# Patient Record
Sex: Female | Born: 2000 | Hispanic: Yes | Marital: Single | Smoking: Never smoker
Health system: Southern US, Community
[De-identification: ages and names within clinical notes are randomized; demographics above are authoritative.]

## PROBLEM LIST (undated history)

## (undated) DIAGNOSIS — E282 Polycystic ovarian syndrome: Secondary | ICD-10-CM

## (undated) DIAGNOSIS — J45909 Unspecified asthma, uncomplicated: Secondary | ICD-10-CM

## (undated) HISTORY — PX: OTHER SURGICAL HISTORY: SHX169

---
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
## 2019-01-03 ENCOUNTER — Other Ambulatory Visit: Payer: Self-pay

## 2019-01-03 ENCOUNTER — Encounter (HOSPITAL_COMMUNITY): Payer: Self-pay

## 2019-01-03 ENCOUNTER — Ambulatory Visit (HOSPITAL_COMMUNITY)
Admission: EM | Admit: 2019-01-03 | Discharge: 2019-01-03 | Disposition: A | Discharge status: Home / Self Care | Payer: Medicaid Other | Attending: Family Medicine | Admitting: Family Medicine

## 2019-01-03 DIAGNOSIS — J069 Acute upper respiratory infection, unspecified: Secondary | ICD-10-CM | POA: Diagnosis not present

## 2019-01-03 DIAGNOSIS — B9789 Other viral agents as the cause of diseases classified elsewhere: Secondary | ICD-10-CM

## 2019-01-03 DIAGNOSIS — Z20828 Contact with and (suspected) exposure to other viral communicable diseases: Secondary | ICD-10-CM | POA: Diagnosis not present

## 2019-01-03 DIAGNOSIS — Z20822 Contact with and (suspected) exposure to covid-19: Secondary | ICD-10-CM

## 2019-01-03 NOTE — Discharge Instructions (Addendum)
If your Covid-19 test is positive, you will receive a phone call from [facility] regarding your results. Negative test results are not called. Both positive and negative results area always visible on MyChart. If you do not have a MyChart account, sign up instructions are in your discharge papers.  

## 2019-01-03 NOTE — ED Triage Notes (Signed)
Pt states she is congested and ear discomfort x 2 days

## 2019-01-04 NOTE — ED Provider Notes (Signed)
[place]   [id number] 01/03/19 Arrival Time: 4268  ASSESSMENT & PLAN:  1. Viral URI   2. Exposure to COVID-19 virus      COVID testing sent. To self-quarantine until she is feeling better and results are available.   OTC symptom care as needed. Ensure adequate fluid intake and rest. May f/u with PCP or here as needed.  Reviewed expectations re: course of current medical issues. Questions answered. Outlined signs and symptoms indicating need for more acute intervention. Patient verbalized understanding. After Visit Summary given.   SUBJECTIVE: History from: patient.  Casey Hall is a 18 y.o. female who presents with complaint of nasal congestion and ear discomfort. Onset abrupt, 2 d ago; with mild fatigue and without body aches. SOB: none. Wheezing: none. Fever: believed to be present, temp not taken. Overall normal PO intake without n/v. Possible exposure to COVID. No specific or significant aggravating or alleviating factors reported. OTC treatment: none reported.  Received flu shot this year: no.  Social History   Tobacco Use  Smoking Status Never Smoker  Smokeless Tobacco Never Used    ROS: As per HPI.   OBJECTIVE:  Vitals:   01/03/19 1414 01/03/19 1415  BP: 100/70   Pulse: 81   Resp: 16   Temp: 99.4 F (37.4 C)   TempSrc: Oral   SpO2: 98%   Weight:  59 kg     General appearance: alert; appears fatigued HEENT: mild nasal congestion; clear runny nose Neck: supple without LAD CV: RRR Lungs: unlabored respirations, symmetrical air entry without wheezing; cough: absent Abd: soft Ext: no LE edema Skin: warm and dry Psychological: alert and cooperative; normal mood and affect    No Known Allergies  Past Medical History:  Diagnosis Date  . Asthma   . PCOS (polycystic ovarian syndrome)    Family History  Problem Relation Age of Onset  . Healthy Mother   . Stroke Maternal Grandmother   . Heart disease Maternal Grandmother     Social History   Socioeconomic History  . Marital status: Single    Spouse name: Not on file  . Number of children: Not on file  . Years of education: Not on file  . Highest education level: Not on file  Occupational History  . Not on file  Social Needs  . Financial resource strain: Not on file  . Food insecurity    Worry: Not on file    Inability: Not on file  . Transportation needs    Medical: Not on file    Non-medical: Not on file  Tobacco Use  . Smoking status: Never Smoker  . Smokeless tobacco: Never Used  Substance and Sexual Activity  . Alcohol use: No    Alcohol/week: 0.0 standard drinks  . Drug use: No  . Sexual activity: Never  Lifestyle  . Physical activity    Days per week: Not on file    Minutes per session: Not on file  . Stress: Not on file  Relationships  . Social Herbalist on phone: Not on file    Gets together: Not on file    Attends religious service: Not on file    Active member of club or organization: Not on file    Attends meetings of clubs or organizations: Not on file    Relationship status: Not on file  . Intimate partner violence    Fear of current or ex partner: Not on file    Emotionally abused:  Not on file    Physically abused: Not on file    Forced sexual activity: Not on file  Other Topics Concern  . Not on file  Social History Narrative   Lives with mom, step-dad, brother and sister.  In 9th grade at [employer].            Mardella Layman, MD 01/04/19 1047

## 2019-01-05 LAB — NOVEL CORONAVIRUS, NAA (HOSP ORDER, SEND-OUT TO REF LAB; TAT 18-24 HRS): SARS-CoV-2, NAA: NOT DETECTED

## 2019-03-12 ENCOUNTER — Encounter: Payer: Self-pay | Admitting: Family

## 2019-03-13 ENCOUNTER — Encounter: Payer: Self-pay | Admitting: Family

## 2019-03-14 ENCOUNTER — Other Ambulatory Visit: Payer: Self-pay

## 2019-03-14 ENCOUNTER — Other Ambulatory Visit (HOSPITAL_COMMUNITY)
Admission: RE | Admit: 2019-03-14 | Discharge: 2019-03-14 | Disposition: A | Discharge status: Home / Self Care | Payer: Medicaid Other | Source: Ambulatory Visit | Attending: Pediatrics | Admitting: Pediatrics

## 2019-03-14 ENCOUNTER — Other Ambulatory Visit: Payer: Medicaid Other

## 2019-03-14 DIAGNOSIS — Z113 Encounter for screening for infections with a predominantly sexual mode of transmission: Secondary | ICD-10-CM

## 2019-03-14 NOTE — Progress Notes (Signed)
Patient came in for labs Wet prep and Gc urine. Labs ordered by Alfonso Ramus. Successful collection.

## 2019-03-15 ENCOUNTER — Encounter: Payer: Self-pay | Admitting: Family

## 2019-03-15 ENCOUNTER — Other Ambulatory Visit: Payer: Self-pay | Admitting: Pediatrics

## 2019-03-15 LAB — WET PREP BY MOLECULAR PROBE
Candida species: DETECTED — AB
MICRO NUMBER:: 10138470
SPECIMEN QUALITY:: ADEQUATE
Trichomonas vaginosis: NOT DETECTED

## 2019-03-15 MED ORDER — METRONIDAZOLE 500 MG PO TABS: 500.0000 mg | ORAL_TABLET | Freq: Two times a day (BID) | ORAL | 0 refills | Status: AC

## 2019-03-15 MED ORDER — FLUCONAZOLE 150 MG PO TABS: ORAL_TABLET | ORAL | 0 refills | Status: DC

## 2019-03-16 LAB — URINE CYTOLOGY ANCILLARY ONLY
Chlamydia: POSITIVE — AB
Comment: NEGATIVE
Comment: NORMAL
Neisseria Gonorrhea: NEGATIVE

## 2019-03-19 ENCOUNTER — Encounter: Payer: Self-pay | Admitting: Family

## 2019-03-20 ENCOUNTER — Other Ambulatory Visit: Payer: Self-pay

## 2019-03-20 ENCOUNTER — Encounter: Payer: Self-pay | Admitting: Family

## 2019-03-20 ENCOUNTER — Other Ambulatory Visit (INDEPENDENT_AMBULATORY_CARE_PROVIDER_SITE_OTHER): Payer: Medicaid Other

## 2019-03-20 DIAGNOSIS — A749 Chlamydial infection, unspecified: Secondary | ICD-10-CM

## 2019-03-20 MED ORDER — AZITHROMYCIN 500 MG PO TABS
1000.0000 mg | ORAL_TABLET | Freq: Once | ORAL | Status: AC
  Administered 2019-03-20: 14:00:00 1000 mg via ORAL

## 2019-03-20 NOTE — Progress Notes (Unsigned)
Pt here today for STI treatment. Allergies reviewed. Medication tolerated well. Pt education given- along with importance to abstain from sex for 7 days to ensure infection has cured. Follow up appointment scheduled with provider.  

## 2019-03-26 NOTE — Progress Notes (Unsigned)
Patient came in for labs Wet prep and GC urine. Labs ordered by Bernell List. Successful collection.

## 2019-04-05 ENCOUNTER — Ambulatory Visit: Payer: Medicaid Other | Admitting: Family

## 2019-04-09 ENCOUNTER — Encounter: Payer: Self-pay | Admitting: Family

## 2019-04-12 ENCOUNTER — Other Ambulatory Visit: Payer: Self-pay

## 2019-04-12 ENCOUNTER — Ambulatory Visit (INDEPENDENT_AMBULATORY_CARE_PROVIDER_SITE_OTHER): Payer: Medicaid Other | Admitting: Family

## 2019-04-12 ENCOUNTER — Encounter: Payer: Self-pay | Admitting: Family

## 2019-04-12 ENCOUNTER — Other Ambulatory Visit (HOSPITAL_COMMUNITY)
Admission: RE | Admit: 2019-04-12 | Discharge: 2019-04-12 | Disposition: A | Discharge status: Home / Self Care | Payer: Medicaid Other | Source: Ambulatory Visit | Attending: Family | Admitting: Family

## 2019-04-12 VITALS — BP 114/69 | HR 68 | Ht 62.21 in | Wt 136.2 lb

## 2019-04-12 DIAGNOSIS — Z3046 Encounter for surveillance of implantable subdermal contraceptive: Secondary | ICD-10-CM

## 2019-04-12 DIAGNOSIS — Z113 Encounter for screening for infections with a predominantly sexual mode of transmission: Secondary | ICD-10-CM

## 2019-04-12 DIAGNOSIS — Z3009 Encounter for other general counseling and advice on contraception: Secondary | ICD-10-CM | POA: Diagnosis not present

## 2019-04-12 DIAGNOSIS — A749 Chlamydial infection, unspecified: Secondary | ICD-10-CM

## 2019-04-12 NOTE — Patient Instructions (Signed)
Follow-up  in 1 month. Schedule this appointment before you leave clinic today.  Your Nexplanon was removed today and is no longer preventing pregnancy.  If you have sex, remember to use condoms to prevent pregnancy and to prevent sexually transmitted infections.  Leave the outside bandage on for 24 hours.  Leave the smaller bandages on for 3-5 days or until they fall off on their own.  Keep the area clean and dry for 3-5 days.  There is usually bruising or swelling at and around the removal site for a few days to a week after the removal.  If you see redness or pus draining from the removal site, call us immediately.  We would like you to return to the clinic for a follow-up visit in 1 month.  You can call [facility] 24 hours a day with any questions or concerns.  There is always a nurse or doctor available to take your call.  Call 9-1-1 if you have a life-threatening emergency.  For anything else, please call us at [phone] before heading to the ER.  

## 2019-04-12 NOTE — Progress Notes (Addendum)
Adolescent Medicine Consultation Follow-Up Visit Casey Hall  is a 19 y.o. female referred by Corena Herter, MD here today for follow-up regarding Nexplanon removal.    Plan at last adolescent specialty clinic visit included: 03/31/2018: PCOS visit - continue Nexplanon for period suppression  -acne -- po abx and benzaclin gel  Pertinent Labs? No Growth Chart Viewed? n/a   History was provided by the patient.  Interpreter? no  Chief complaint:  Nexplanon removal  Desire for STD testing  HPI:   PCP Confirmed?  yes  My Chart Activated?   yes   Casey Hall presents today for nexplanon removal. She would like it out to see if she has a period (has had complete suppression on nexplanon). Is sexually active with multiple female partners. Occasional condom use. She tested positive for chlamydia 2 weeks ago and was treated with "pills only". No symptoms at the time or at present. Specifically, no vaginal discharge, pain, or itching. Wants to retest today for STDs today. Reports that she doesn't know who may have given it to her. Has not told her partners yet.  She has no plans for next steps in terms of BC. Would like more condoms today  No abdominal pain, fever, or vomiting/diarrhea. ROS otherwise negative.   Casey Hall had her nexplanon placed in her L arm in April 2019.    No LMP recorded. Patient has had an implant. No Known Allergies Current Outpatient Medications on File Prior to Visit  Medication Sig Dispense Refill  . etonogestrel (NEXPLANON) 68 MG IMPL implant 1 each by Subdermal route once.    . fluconazole (DIFLUCAN) 150 MG tablet Take 1 tablet today and 1 tablet 3 days from now (Patient not taking: Reported on 04/12/2019) 2 tablet 0  . silver sulfADIAZINE (SILVADENE) 1 % cream Apply 1 application topically 2 (two) times daily. (Patient not taking: Reported on 04/12/2019) 50 g 0   No current facility-administered medications on file prior to visit.    Patient  Active Problem List   Diagnosis Date Noted  . Nexplanon in place 04/01/2018  . Bilateral nipple discharge 07/28/2015  . Constipation 11/25/2014  . PCOS (polycystic ovarian syndrome) 07/09/2014  . Vitamin D deficiency 07/09/2014  . 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  female  Sexually Active?  yes; removal of nexplanon as noted above. Plans for condoms after   The following portions of the patient's history were reviewed and updated as appropriate: allergies, current medications, past family history, past medical history, past social history, past surgical history and problem list.  Physical Exam:  Vitals:   04/12/19 1046  BP: 114/69  Pulse: 68  Weight: 136 lb 3.2 oz (61.8 kg)  Height: 5' 2.21" (1.58 m)   BP 114/69   Pulse 68   Ht 5' 2.21" (1.58 m)   Wt 136 lb 3.2 oz (61.8 kg)   BMI 24.75 kg/m  Body mass index: body mass index is 24.75 kg/m. Blood pressure percentiles are not available for patients who are 18 years or older.  Physical Exam Vitals and nursing note reviewed.  Constitutional:      General: She is not in acute distress.    Appearance: Normal appearance. She is not ill-appearing, toxic-appearing or diaphoretic.  HENT:     Head: Normocephalic.     Nose: Nose normal. No congestion or rhinorrhea.  Mouth/Throat:     Mouth: Mucous membranes are moist.  Eyes:     Conjunctiva/sclera: Conjunctivae normal.  Cardiovascular:     Rate and Rhythm: Normal rate.     Pulses: Normal pulses.  Pulmonary:     Effort: Pulmonary effort is normal.  Abdominal:     General: Abdomen is flat. There is no distension.  Genitourinary:    Comments: Deferred Skin:    General: Skin is warm.     Capillary Refill: Capillary refill takes less than 2 seconds.     Comments: 2 small scars on L forearm at the site of  nexplanon insertion. No discoloration.    NEXPLANON REMOVAL: Risks & benefits of Nexplanon removal discussed. Consent form signed.  The patient denies any allergies to anesthetics or antiseptics.  Procedure: Pt was placed in supine position. left arm was flexed at the elbow and externally rotated so that her wrist was parallel to her ear, The device was palpated and marked. The site was cleaned with Betadine. The area surrounding the device was covered with a sterile drape. 1% lidocaine was injected just under the device. A scalpel was used to create a small incision. The device was pushed towards the incision. Fibrous tissue surrounding the device was gradually removed from the device. The device was removed and measured to ensure all 4 cm of device was removed. Steri-strips were used to close the incision. Pressure dressing was applied to the patient.  The patient was instructed to removed the pressure dressing in 24 hrs.  The patient was advised to move slowly from a supine to an upright position  The patient denied any concerns or complaints  The patient was instructed to schedule a follow-up appt in 1 month. The patient will be called in 1 week to address any concerns.  Assessment/Plan:  1. Encounter for Nexplanon removal - Nexplanon removed today. We discussed how she will no longer be protected from pregnancy and how removing it "to see if she bleeds" may not be the best indication for removal. Patient expressed understanding and elected to proceed. See procedure note above. Tolerated procedure well with minimal blood loss - f/u in 1 month  2. Chlamydia infection 3. Routine screening for STI (sexually transmitted infection) - had chlamydia two weeks ago and was treated - Asymptomatic thien, asymptomatic now - Has been sexually active since. Partners have not been told and have not been tested/treated as far as she knows - Advised her to tell her partners and recommend  they get tested - Will retest and treat if needed today. - Talked about importance of abstinence for the next week or linger if partner(s) not tested/treated - Urine cytology ancillary only  4. Birth control counseling - return in 1 mo to re-discuss starting another hormonal contraception method (not interested today) - Condoms and EC reviewed  Follow-up:  Return for mood and nexplanon removal f/u in 1 mo.   Medical decision-making:  >25 minutes spent face to face with patient with more than 50% of appointment spent discussing diagnosis, management, follow-up, and reviewing of prior records.  Renee Rival, MD  Supervising Provider Co-Signature  I reviewed with the resident the medical history and the resident's findings.  I discussed with the resident the patient's diagnosis and concur with the treatment plan as documented in the resident's note.  Parthenia Ames, NP

## 2019-04-13 ENCOUNTER — Encounter: Payer: Self-pay | Admitting: Family

## 2019-04-13 LAB — URINE CYTOLOGY ANCILLARY ONLY
Chlamydia: POSITIVE — AB
Comment: NEGATIVE
Comment: NORMAL
Neisseria Gonorrhea: NEGATIVE

## 2019-04-16 ENCOUNTER — Other Ambulatory Visit: Payer: Self-pay | Admitting: Family

## 2019-04-16 MED ORDER — AZITHROMYCIN 500 MG PO TABS: 1000.0000 mg | ORAL_TABLET | Freq: Once | ORAL | 0 refills | Status: AC

## 2019-04-26 ENCOUNTER — Encounter (HOSPITAL_COMMUNITY): Payer: Self-pay

## 2019-04-26 ENCOUNTER — Other Ambulatory Visit: Payer: Self-pay

## 2019-04-26 ENCOUNTER — Emergency Department (HOSPITAL_COMMUNITY)
Admission: EM | Admit: 2019-04-26 | Discharge: 2019-04-27 | Disposition: A | Discharge status: Home / Self Care | Payer: Medicaid Other | Attending: Emergency Medicine | Admitting: Emergency Medicine

## 2019-04-26 DIAGNOSIS — Z5321 Procedure and treatment not carried out due to patient leaving prior to being seen by health care provider: Secondary | ICD-10-CM | POA: Diagnosis not present

## 2019-04-26 DIAGNOSIS — M545 Low back pain: Secondary | ICD-10-CM | POA: Insufficient documentation
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
## 2019-04-27 ENCOUNTER — Ambulatory Visit (HOSPITAL_COMMUNITY)
Admission: EM | Admit: 2019-04-27 | Discharge: 2019-04-27 | Disposition: A | Discharge status: Home / Self Care | Payer: Medicaid Other

## 2019-04-27 ENCOUNTER — Encounter (HOSPITAL_COMMUNITY): Payer: Self-pay

## 2019-04-27 ENCOUNTER — Other Ambulatory Visit: Payer: Self-pay

## 2019-04-27 DIAGNOSIS — M545 Low back pain, unspecified: Secondary | ICD-10-CM

## 2019-04-27 MED ORDER — TIZANIDINE HCL 4 MG PO TABS: 4.0000 mg | ORAL_TABLET | Freq: Three times a day (TID) | ORAL | 0 refills | Status: DC | PRN

## 2019-04-27 MED ORDER — NAPROXEN 500 MG PO TABS: 500.0000 mg | ORAL_TABLET | Freq: Two times a day (BID) | ORAL | 0 refills | Status: DC
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
## 2019-04-27 NOTE — ED Notes (Signed)
Pt called for room, no answer x1  °
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
## 2019-05-04 ENCOUNTER — Encounter: Payer: Self-pay | Admitting: Family

## 2019-05-14 ENCOUNTER — Encounter: Payer: Self-pay | Admitting: Family

## 2019-05-14 ENCOUNTER — Other Ambulatory Visit: Payer: Self-pay

## 2019-05-14 ENCOUNTER — Other Ambulatory Visit (HOSPITAL_COMMUNITY)
Admission: RE | Admit: 2019-05-14 | Discharge: 2019-05-14 | Disposition: A | Discharge status: Home / Self Care | Payer: Medicaid Other | Source: Ambulatory Visit | Attending: Psychology | Admitting: Psychology

## 2019-05-14 ENCOUNTER — Other Ambulatory Visit: Payer: Medicaid Other

## 2019-05-14 DIAGNOSIS — Z113 Encounter for screening for infections with a predominantly sexual mode of transmission: Secondary | ICD-10-CM

## 2019-05-15 NOTE — Progress Notes (Signed)
Patient came in for labs GC urine. Labs ordered by Alfonso Ramus. Successful collection.

## 2019-05-16 ENCOUNTER — Telehealth: Payer: Medicaid Other | Admitting: Family

## 2019-05-16 NOTE — Progress Notes (Deleted)
Pre-Visit Planning Dorena Bodo Clinical Staff Visit Tasks:   - Urine GC/CT due? {Yes/No-Ex:120004} - HIV Screening due?  {Yes/No-Ex:120004} - POCT or Other?{Responses; yes/no/unknown/maybe/na:33144} - Psych Screenings Due? {Responses; yes/no/unknown/maybe/na:33144} - [facility] Involvement? {Responses; yes/no/unknown/maybe/na:33144} @TODAY @  @NAME @  is a @AGEPEDS @ @SEX @ referred by @PCP @ for ***.  Review of records sent: ***  Previous Psych Screenings? {Responses; yes/no/unknown/maybe/na:33144}  Plan at last visit? {Responses; yes/no/unknown/maybe/na:33144} Pertinent Labs? {Responses; yes/no/unknown/maybe/na:33144}    This note is not being shared with the patient for the following reason: {Block Note Sharing with Patient:23411}  THIS RECORD MAY CONTAIN CONFIDENTIAL INFORMATION THAT SHOULD NOT BE RELEASED WITHOUT REVIEW OF THE SERVICE PROVIDER.  Virtual Follow-Up Visit via Video Note  I connected with Casey Hall 's {family members:20773}  on 05/16/19 at 10:30 AM EDT by a video enabled telemedicine application and verified that I am speaking with the correct person using two identifiers.   Patient/parent location: ***   I discussed the limitations of evaluation and management by telemedicine and the availability of in person appointments.  I discussed that the purpose of this telehealth visit is to provide medical care while limiting exposure to the novel coronavirus.  The {family members:20773} expressed understanding and agreed to proceed.   Casey Hall is a 19 y.o. female referred by , MD here today for follow-up of ***.  Previsit planning completed:  {YES/NO/NOT APPLICABLE:20182}   History was provided by the {CHL AMB PERSONS; PED RELATIVES/OTHER W/PATIENT:[phone]}.  Plan from Last Visit:  04/12/19 -Nexplanon removed -Treated for chlamydia reinfection  Chief Complaint: ***  History of Present Illness:  ***    ROS:  ***  No Known  Allergies Outpatient Medications Prior to Visit  Medication Sig Dispense Refill  . azithromycin (ZITHROMAX) 500 MG tablet TAKE 2 TABLETS BY MOUTH ONCE FOR 1 DOSE    . naproxen (NAPROSYN) 500 MG tablet Take 1 tablet (500 mg total) by mouth 2 (two) times daily with a meal. 30 tablet 0  . tiZANidine (ZANAFLEX) 4 MG tablet Take 1 tablet (4 mg total) by mouth every 8 (eight) hours as needed for muscle spasms. 30 tablet 0   No facility-administered medications prior to visit.     Patient Active Problem List   Diagnosis Date Noted  . Nexplanon in place 04/01/2018  . Bilateral nipple discharge 07/28/2015  . Constipation 11/25/2014  . PCOS (polycystic ovarian syndrome) 07/09/2014  . Vitamin D deficiency 07/09/2014  . History of irregular menstrual bleeding 07/09/2014    Social History: Lives with:  {Persons; PED relatives w/patient:19415} and describes home situation as *** School: In Grade *** at 07/30/2015 Future Plans:  {CHL AMB PED FUTURE 11/27/2014 Exercise:  {Exercise:23478} Sports:  {Misc; sports:10024} Sleep:  {SX; SLEEP PATTERNS:18802}  Confidentiality was discussed with the patient and if applicable, with caregiver as well.  Patient's personal or confidential phone number: *** Enter confidential phone number in Family Comments section of [employer] Tobacco?  {YES/NO/WILD 09/08/2014 Drugs/ETOH?  {YES/NO/WILD 09/08/2014 Partner preference?  {CHL AMB PARTNER [phone]} Sexually Active?  {YES/NO/WILD 09/08/2014  Pregnancy Prevention:  {Pregnancy [phone]}, reviewed condoms & plan B Trauma currently or in the pastt?  {YES/NO/WILD [employer] Suicidal or Self-Harm thoughts?   {YES/NO/WILD ZOXWR:[id number]} Guns in the home?  {YES/NO/WILD BJYNW:29562}  {Common ambulatory SmartLinks:19316}  Visual Observations/Objective:  *** General Appearance: Well nourished well developed, in no apparent distress.  Eyes: conjunctiva no swelling or  erythema ENT/Mouth: No hoarseness, No cough for duration of visit.  Neck: Supple  Respiratory: Respiratory  effort normal, normal rate, no retractions or distress.   Cardio: Appears well-perfused, noncyanotic Musculoskeletal: no obvious deformity Skin: visible skin without rashes, ecchymosis, erythema Neuro: Awake and oriented X 3,  Psych:  normal affect, Insight and Judgment appropriate.    Assessment/Plan: There are no diagnoses linked to this encounter.   I discussed the assessment and treatment plan with the patient and/or parent/guardian.  They were provided an opportunity to ask questions and all were answered.  They agreed with the plan and demonstrated an understanding of the instructions. They were advised to call back or seek an in-person evaluation in the emergency room if the symptoms worsen or if the condition fails to improve as anticipated.   Follow-up:   ***  Medical decision-making:   I spent *** minutes on this telehealth visit inclusive of face-to-face video and care coordination time I was located *** during this encounter.   Mellody Drown, MD    CC: Patient, No Pcp Per, Madelyn Flavors, MD

## 2019-05-17 LAB — URINE CYTOLOGY ANCILLARY ONLY
Chlamydia: POSITIVE — AB
Comment: NEGATIVE
Comment: NEGATIVE
Comment: NORMAL
Neisseria Gonorrhea: NEGATIVE
Trichomonas: NEGATIVE

## 2019-05-18 ENCOUNTER — Encounter: Payer: Self-pay | Admitting: Family

## 2019-05-21 ENCOUNTER — Other Ambulatory Visit: Payer: Self-pay | Admitting: Family

## 2019-05-21 MED ORDER — AZITHROMYCIN 500 MG PO TABS: 1000.0000 mg | ORAL_TABLET | Freq: Once | ORAL | 0 refills | Status: AC

## 2019-05-22 ENCOUNTER — Encounter: Payer: Self-pay | Admitting: Family

## 2019-05-22 NOTE — Progress Notes (Signed)
Not seen. Closed for admin purposes.  

## 2019-05-29 ENCOUNTER — Encounter: Payer: Self-pay | Admitting: Family

## 2019-06-18 ENCOUNTER — Encounter: Payer: Self-pay | Admitting: Family

## 2019-06-19 ENCOUNTER — Other Ambulatory Visit: Payer: Medicaid Other

## 2019-06-19 ENCOUNTER — Other Ambulatory Visit (HOSPITAL_COMMUNITY)
Admission: RE | Admit: 2019-06-19 | Discharge: 2019-06-19 | Disposition: A | Discharge status: Home / Self Care | Payer: Medicaid Other | Source: Ambulatory Visit | Attending: Family | Admitting: Family

## 2019-06-19 ENCOUNTER — Other Ambulatory Visit: Payer: Self-pay

## 2019-06-19 DIAGNOSIS — Z113 Encounter for screening for infections with a predominantly sexual mode of transmission: Secondary | ICD-10-CM | POA: Diagnosis not present

## 2019-06-19 NOTE — Progress Notes (Signed)
GC urine collected. SM CMA

## 2019-06-20 LAB — URINE CYTOLOGY ANCILLARY ONLY
Chlamydia: NEGATIVE
Comment: NEGATIVE
Comment: NEGATIVE
Comment: NORMAL
Neisseria Gonorrhea: NEGATIVE
Trichomonas: NEGATIVE

## 2021-08-13 ENCOUNTER — Encounter: Payer: Medicaid Other | Admitting: Radiology

## 2023-02-02 NOTE — L&D Delivery Note (Addendum)
 Delivery Note Casey Hall is a 22 y.o. G1P0 at [redacted]w[redacted]d admitted for labor.   GBS Status:  PRESUMPTIVE NEGATIVE/-- (10/15 2208)  Labor course: Initial SVE: 6.5/100/-3. Augmentation with: AROM. She then progressed to complete.  ROM: 4h 82m with moderate mec stained fluid  Birth: After a 30 minute 2nd stage, she delivered a Live born female  Birth Weight:   APGAR: 8, 9  Newborn Delivery   Birth date/time: 11/18/2023 00:07:23 Delivery type: Vaginal, Vacuum assisted    FHR would dip into the 70s-80s w/pushing, generally returning to baseline However, at +4 station, FHRin the 60s and 70s. Dr. Abigail notified of impending VAD. Verbal consent: obtained from patient. Risks and benefits discussed in detail. Risks include, but are not limited to bleeding, infection, damage to maternal tissues, fetal cephalhematoma. There is also the risk of inability to effect vaginal delivery of the head, or shoulder dystocia that cannot be resolved by established maneuvers, leading to the need for emergency cesarean section     Delivered via Vacuum assisted vaginal delivery.  (Presentation: OA ). Nuchal cord present: No. . Shoulders and body delivered in usual fashion. Infant placed directly on mom's abdomen for bonding/skin-to-skin, baby dried and stimulated. At this point, Dr. Abigail in room and took over care (CNM had to go to another delivery). Cord clamped x 2 after 1 minute and cut by FOB.  Cord blood collected. Placenta delivered-Spontaneous with 3 vessels. 20u Pitocin in 500cc LR given as a bolus prior to delivery of placenta.  Fundus firm with massage. Placenta inspected and appears to be intact with a 3 VC.  Sponge and instrument count were correct x2.  Intrapartum complications:  None Anesthesia:  local Lacerations:  2nd degree Suture Repair: 2.0 3.0 vicryl EBL (mL):769.00   PER DR DUNN:   Delivery was performed by CNM Cresenzo. She was called to another delivery and I performed delivery of the  placenta and repair of 2nd degree perineal laceration. Placenta delivered without difficulty. Second degree laceration and right labial laceration was repaired in the usual fashion with 2-0 and 3-0 vicryl after injecting 10 ml of 1% lidocaine  for local anesthetic. TXA was given.   Rollo ONEIDA Abigail, MD, FACOG Obstetrician & Gynecologist, [facility] for [facility], [facility]    Mom to postpartum.  Baby to Couplet care / Skin to Skin. Placenta to L&D   Plans to Breast and bottlefeed Contraception: abstinence Circumcision: declines  Delivery Report:   Review the Delivery Report for details.     Signed: Cathlean Ely, DNP,CNM 11/18/2023, 2:36 AM

## 2023-07-03 ENCOUNTER — Other Ambulatory Visit: Payer: Self-pay

## 2023-07-03 ENCOUNTER — Encounter (HOSPITAL_COMMUNITY): Payer: Self-pay | Admitting: *Deleted

## 2023-07-03 ENCOUNTER — Ambulatory Visit (HOSPITAL_COMMUNITY)
Admission: EM | Admit: 2023-07-03 | Discharge: 2023-07-03 | Disposition: A | Discharge status: Home / Self Care | Attending: Physician Assistant | Admitting: Physician Assistant

## 2023-07-03 DIAGNOSIS — J302 Other seasonal allergic rhinitis: Secondary | ICD-10-CM

## 2023-07-03 DIAGNOSIS — Z3A21 21 weeks gestation of pregnancy: Secondary | ICD-10-CM | POA: Diagnosis not present

## 2023-07-03 DIAGNOSIS — J4521 Mild intermittent asthma with (acute) exacerbation: Secondary | ICD-10-CM

## 2023-07-03 MED ORDER — ALBUTEROL SULFATE HFA 108 (90 BASE) MCG/ACT IN AERS: 1.0000 | INHALATION_SPRAY | Freq: Four times a day (QID) | RESPIRATORY_TRACT | 0 refills | Status: AC | PRN

## 2023-07-03 NOTE — ED Triage Notes (Signed)
 PT reports congestion with ear pressure and sore throat for 4 days.

## 2023-07-03 NOTE — Discharge Instructions (Signed)
 Good to meet you today.  The good news is I do not see any further signs of acute infection or further need for antibiotics at this time.  You are likely experiencing common cold or allergies that are triggering your history of asthma.  Without wheezing on exam, I do not think you need any steroids, and this would need to be used very cautiously as you are pregnant anyway.  I do think that albuterol rescue inhaler is appropriate to use as directed to help with your asthma symptoms.  Please refer to the handout provided for medications safe in pregnancy such as Claritin, saline nasal drops, Sudafed (only for a few days, and as long as no history of high blood pressure).  Keep well-hydrated.  Try to rest.  If asthma symptoms persist or worsen, you will need to follow-up with your OB/GYN and/or possibly pulmonology.

## 2023-07-03 NOTE — ED Triage Notes (Signed)
 PT EDD Oct 2025

## 2023-07-03 NOTE — ED Provider Notes (Signed)
 [facility]   MRN: [id number] DOB: 24-Jun-2000  Subjective:   Casey Hall is a 23 y.o. female presenting for sore throat and ear pressure, some congestion, going on the last 4 days.  She is here with her mother.  She is [redacted] weeks pregnant.  She states that she had a right ear infection last week and was treated with amoxicillin.  She has since completed this course, and she has not been taking any other over-the-counter medication.  She has a history of asthma as a child and has not had any flareups in a long time, but feels similar symptoms in her chest.  Denies any shortness of breath or chest pain.  Feels like she is kind of wheezy at night.  She does not have a rescue inhaler.  Denies any fever or chills.    No current facility-administered medications for this encounter.  Current Outpatient Medications:    albuterol (VENTOLIN HFA) 108 (90 Base) MCG/ACT inhaler, Inhale 1-2 puffs into the lungs every 6 (six) hours as needed for wheezing or shortness of breath., Disp: 18 g, Rfl: 0   No Known Allergies  Past Medical History:  Diagnosis Date   Asthma    PCOS (polycystic ovarian syndrome)      Past Surgical History:  Procedure Laterality Date   implanon       Family History  Problem Relation Age of Onset   Healthy Mother    Stroke Maternal Grandmother    Heart disease Maternal Grandmother     Social History   Tobacco Use   Smoking status: Never   Smokeless tobacco: Never  Substance Use Topics   Alcohol use: No    Alcohol/week: 0.0 standard drinks of alcohol   Drug use: No    ROS REFER TO HPI FOR PERTINENT POSITIVES AND NEGATIVES   Objective:   Vitals: BP 111/74   Pulse 93   Temp 98.1 F (36.7 C)   Resp 20   LMP 02/05/2023   SpO2 96%   Physical Exam Vitals and nursing note reviewed.  Constitutional:      General: She is not in acute distress.    Appearance: Normal appearance. She is not ill-appearing.  HENT:     Head:  Normocephalic.     Right Ear: Tympanic membrane, ear canal and external ear normal.     Left Ear: Tympanic membrane, ear canal and external ear normal.     Nose: Congestion present.     Mouth/Throat:     Mouth: Mucous membranes are moist.     Pharynx: No oropharyngeal exudate or posterior oropharyngeal erythema.  Eyes:     Extraocular Movements: Extraocular movements intact.     Conjunctiva/sclera: Conjunctivae normal.     Pupils: Pupils are equal, round, and reactive to light.  Cardiovascular:     Rate and Rhythm: Normal rate and regular rhythm.     Pulses: Normal pulses.     Heart sounds: Normal heart sounds. No murmur heard. Pulmonary:     Effort: Pulmonary effort is normal. No respiratory distress.     Breath sounds: Normal breath sounds. No wheezing.  Musculoskeletal:     Cervical back: Normal range of motion.  Skin:    General: Skin is warm.  Neurological:     Mental Status: She is alert and oriented to person, place, and time.  Psychiatric:        Mood and Affect: Mood normal.        Behavior: Behavior  normal.     No results found for this or any previous visit (from the past 24 hours).  Assessment and Plan :   PDMP not reviewed this encounter.  1. Seasonal allergies   2. Mild intermittent asthma with acute exacerbation   3. [redacted] weeks gestation of pregnancy    No acute signs of infection on exam.  Overall well-appearing.  No active wheezing on exam, although she notes wheezing at night.  I have provided her with a new rescue inhaler.  I have also provided her with a list of over-the-counter medications that are safe in pregnancy.  I do not see a need for further antibiotics at this time, and she has just completed a course amoxicillin.  She will use the albuterol inhaler as needed.  She will use nasal saline, fluids, Claritin or Sudafed.  She will follow-up with her OB/GYN and/or pulmonology if needed.  Return to care precautions discussed.  Patient agreeable.     AllwardtDeleta Felix, PA-C 07/03/23 1153

## 2023-11-12 ENCOUNTER — Inpatient Hospital Stay (HOSPITAL_COMMUNITY): Admit: 2023-11-12

## 2023-11-16 ENCOUNTER — Other Ambulatory Visit: Payer: Self-pay

## 2023-11-16 ENCOUNTER — Encounter (HOSPITAL_COMMUNITY): Payer: Self-pay | Admitting: Obstetrics and Gynecology

## 2023-11-16 ENCOUNTER — Inpatient Hospital Stay (HOSPITAL_COMMUNITY)
Admission: AD | Admit: 2023-11-16 | Discharge: 2023-11-16 | Disposition: A | Discharge status: Home / Self Care | Source: Home / Self Care | Attending: Obstetrics and Gynecology | Admitting: Obstetrics and Gynecology

## 2023-11-16 DIAGNOSIS — Z3A40 40 weeks gestation of pregnancy: Secondary | ICD-10-CM | POA: Insufficient documentation

## 2023-11-16 DIAGNOSIS — O479 False labor, unspecified: Secondary | ICD-10-CM

## 2023-11-16 DIAGNOSIS — O471 False labor at or after 37 completed weeks of gestation: Secondary | ICD-10-CM | POA: Insufficient documentation

## 2023-11-16 LAB — URINALYSIS, ROUTINE W REFLEX MICROSCOPIC
Bilirubin Urine: NEGATIVE
Glucose, UA: NEGATIVE mg/dL
Hgb urine dipstick: NEGATIVE
Ketones, ur: NEGATIVE mg/dL
Nitrite: NEGATIVE
Protein, ur: NEGATIVE mg/dL
Specific Gravity, Urine: 1.02 (ref 1.005–1.030)
pH: 5 (ref 5.0–8.0)

## 2023-11-16 LAB — CBC
HCT: 32.5 % — ABNORMAL LOW (ref 36.0–46.0)
Hemoglobin: 10.1 g/dL — ABNORMAL LOW (ref 12.0–15.0)
MCH: 22.6 pg — ABNORMAL LOW (ref 26.0–34.0)
MCHC: 31.1 g/dL (ref 30.0–36.0)
MCV: 72.7 fL — ABNORMAL LOW (ref 80.0–100.0)
Platelets: 227 K/uL (ref 150–400)
RBC: 4.47 MIL/uL (ref 3.87–5.11)
RDW: 16.5 % — ABNORMAL HIGH (ref 11.5–15.5)
WBC: 6.9 K/uL (ref 4.0–10.5)
nRBC: 0 % (ref 0.0–0.2)

## 2023-11-16 LAB — POCT FERN TEST: POCT Fern Test: NEGATIVE

## 2023-11-16 LAB — WET PREP, GENITAL
Sperm: NONE SEEN
Trich, Wet Prep: NONE SEEN
WBC, Wet Prep HPF POC: >=10 — AB (ref <10)
Yeast Wet Prep HPF POC: NONE SEEN

## 2023-11-16 LAB — GROUP B STREP BY PCR: Group B strep by PCR: NEGATIVE

## 2023-11-16 LAB — RUPTURE OF MEMBRANE (ROM)PLUS: Rom Plus: NEGATIVE

## 2023-11-16 LAB — HEMOGLOBIN A1C
Hgb A1c MFr Bld: 5 % (ref 4.8–5.6)
Mean Plasma Glucose: 96.8 mg/dL

## 2023-11-16 NOTE — MAU Note (Signed)
 MAU Labor Evaluation RN Medical Screening Exam: RN Assessment:  .Casey Hall is a 23 y.o. at [redacted]w[redacted]d here in MAU for evaluation of labor. See RN triage note.   Pain Score: 0-No pain Pain Location: Abdomen  Cervical exam:  Dilation: 3 Effacement (%): 50 Station: -3 Exam by:: Nat Mule, RN  Fetal Monitoring: Baseline Rate (A): 145 bpm Variability: Moderate Accelerations: 15 x 15 Decelerations: None Contraction Frequency (min): occ with UI  Vitals:   11/16/23 2111 11/16/23 2255  BP: 118/78 119/73  Pulse: 84 66  Resp: 18   Temp: 99.4 F (37.4 C)   SpO2: 100%       Medical Decision Making & Provider Communication:  This RN has communicated with: Joesph Sear, PA and C. Cashion, MD  Communicated with MAU provider SBAR report of labor evaluation. Also reviewed contraction pattern and that non-stress test is reactive.  Contraction Frequency (min): occ with UI has been documented with no cervical change over 2 hours not indicating active labor.  Patient denies any other complaints.  Based on this report provider has given order for discharge. A discharge order and diagnosis entered by a provider. Labor discharge precautions reviewed with patient and all questions answered. Patient verbalized understanding.   Plan of Care: Discharge home with strict labor precautions

## 2023-11-16 NOTE — Progress Notes (Signed)
 Reviewed records in Care Everywhere. See relevant OB lab results below:  Initial GC/CT neg HBsAg nonreactive Hep C ab nonreactive HIV negative Rubella IgG antibody Positive RPR nonreactive  Pap 10/21/2021 NILM

## 2023-11-16 NOTE — MAU Note (Signed)
 MAU Labor Triage Note:  .Casey Hall is a 24 y.o. at [redacted]w[redacted]d here in MAU reporting:  Contractions every: irregularly Onset of ctx: 2 or 3 days Pain Score: 6  Pain Location: Abdomen  ROM: possible leaking describes it as a dripping faucet, not gushing Vaginal Bleeding: denies Last SVE: none Labor Pain Management Plan: Planning unmedicated  GBS: Negative  Fetal Movement: Reports positive FM FHT: Fetal Heart Rate Mode: External Baseline Rate (A): 135 bpm  Vitals:   11/16/23 2111  BP: 118/78  Pulse: 84  Resp: 18  Temp: 99.4 F (37.4 C)  SpO2: 100%      Lab orders placed from triage: MAU Labor Eval and Fern swab OB Office: was getting prenatal care in [place] at the [facility] in [place], is going to be here until after baby is born, but has not established care.

## 2023-11-16 NOTE — MAU Provider Note (Signed)
 False labor

## 2023-11-17 ENCOUNTER — Encounter (HOSPITAL_COMMUNITY): Payer: Self-pay | Admitting: Obstetrics and Gynecology

## 2023-11-17 ENCOUNTER — Inpatient Hospital Stay (HOSPITAL_COMMUNITY)
Admission: AD | Admit: 2023-11-17 | Discharge: 2023-11-19 | DRG: 807 | Disposition: A | Discharge status: Home / Self Care | Attending: Obstetrics and Gynecology | Admitting: Obstetrics and Gynecology

## 2023-11-17 DIAGNOSIS — O480 Post-term pregnancy: Secondary | ICD-10-CM | POA: Diagnosis present

## 2023-11-17 DIAGNOSIS — O26893 Other specified pregnancy related conditions, third trimester: Secondary | ICD-10-CM | POA: Diagnosis present

## 2023-11-17 DIAGNOSIS — Z148 Genetic carrier of other disease: Secondary | ICD-10-CM | POA: Diagnosis not present

## 2023-11-17 DIAGNOSIS — Z3A40 40 weeks gestation of pregnancy: Secondary | ICD-10-CM | POA: Diagnosis not present

## 2023-11-17 DIAGNOSIS — Z8249 Family history of ischemic heart disease and other diseases of the circulatory system: Secondary | ICD-10-CM | POA: Diagnosis not present

## 2023-11-17 LAB — CBC
HCT: 36 % (ref 36.0–46.0)
Hemoglobin: 11 g/dL — ABNORMAL LOW (ref 12.0–15.0)
MCH: 22.4 pg — ABNORMAL LOW (ref 26.0–34.0)
MCHC: 30.6 g/dL (ref 30.0–36.0)
MCV: 73.3 fL — ABNORMAL LOW (ref 80.0–100.0)
Platelets: 240 K/uL (ref 150–400)
RBC: 4.91 MIL/uL (ref 3.87–5.11)
RDW: 16.9 % — ABNORMAL HIGH (ref 11.5–15.5)
WBC: 8.7 K/uL (ref 4.0–10.5)
nRBC: 0 % (ref 0.0–0.2)

## 2023-11-17 LAB — RAPID HIV SCREEN (HIV 1/2 AB+AG)
HIV 1/2 Antibodies: NONREACTIVE
HIV-1 P24 Antigen - HIV24: NONREACTIVE

## 2023-11-17 LAB — TYPE AND SCREEN
ABO/RH(D): O POS
Antibody Screen: NEGATIVE

## 2023-11-17 LAB — GC/CHLAMYDIA PROBE AMP (~~LOC~~) NOT AT ARMC
Chlamydia: NEGATIVE
Comment: NEGATIVE
Comment: NORMAL
Neisseria Gonorrhea: NEGATIVE

## 2023-11-17 LAB — ABO/RH: ABO/RH(D): O POS

## 2023-11-17 LAB — HEPATITIS B SURFACE ANTIGEN: Hepatitis B Surface Ag: NONREACTIVE

## 2023-11-17 MED ORDER — SODIUM CHLORIDE 0.9% FLUSH: 3.0000 mL | Freq: Two times a day (BID) | INTRAVENOUS | Status: DC

## 2023-11-17 MED ORDER — LIDOCAINE HCL (PF) 1 % IJ SOLN
30.0000 mL | INTRAMUSCULAR | Status: AC | PRN
  Administered 2023-11-18: 30 mL via SUBCUTANEOUS
  Filled 2023-11-17: qty 30

## 2023-11-17 MED ORDER — HYDROXYZINE HCL 50 MG PO TABS: 50.0000 mg | ORAL_TABLET | Freq: Four times a day (QID) | ORAL | Status: DC | PRN

## 2023-11-17 MED ORDER — TERBUTALINE SULFATE 1 MG/ML IJ SOLN: 0.2500 mg | Freq: Once | INTRAMUSCULAR | Status: DC | PRN

## 2023-11-17 MED ORDER — FENTANYL CITRATE (PF) 100 MCG/2ML IJ SOLN
50.0000 ug | INTRAMUSCULAR | Status: DC | PRN
  Administered 2023-11-17 – 2023-11-18 (×2): 100 ug via INTRAVENOUS
  Filled 2023-11-17 (×2): qty 2

## 2023-11-17 MED ORDER — SODIUM CHLORIDE 0.9% FLUSH: 3.0000 mL | INTRAVENOUS | Status: DC | PRN

## 2023-11-17 MED ORDER — OXYCODONE-ACETAMINOPHEN 5-325 MG PO TABS: 1.0000 | ORAL_TABLET | ORAL | Status: DC | PRN

## 2023-11-17 MED ORDER — OXYTOCIN BOLUS FROM INFUSION
333.0000 mL | Freq: Once | INTRAVENOUS | Status: AC
  Administered 2023-11-18: 333 mL via INTRAVENOUS

## 2023-11-17 MED ORDER — ACETAMINOPHEN 325 MG PO TABS: 650.0000 mg | ORAL_TABLET | ORAL | Status: DC | PRN

## 2023-11-17 MED ORDER — ONDANSETRON HCL 4 MG/2ML IJ SOLN: 4.0000 mg | Freq: Four times a day (QID) | INTRAMUSCULAR | Status: DC | PRN

## 2023-11-17 MED ORDER — SODIUM CHLORIDE 0.9 % IV SOLN: 250.0000 mL | INTRAVENOUS | Status: DC | PRN

## 2023-11-17 MED ORDER — OXYTOCIN-SODIUM CHLORIDE 30-0.9 UT/500ML-% IV SOLN
2.5000 [IU]/h | INTRAVENOUS | Status: DC
  Administered 2023-11-18: 2.5 [IU]/h via INTRAVENOUS
  Filled 2023-11-17: qty 500

## 2023-11-17 MED ORDER — SOD CITRATE-CITRIC ACID 500-334 MG/5ML PO SOLN: 30.0000 mL | ORAL | Status: DC | PRN

## 2023-11-17 MED ORDER — LACTATED RINGERS IV SOLN: INTRAVENOUS | Status: DC

## 2023-11-17 MED ORDER — LACTATED RINGERS IV SOLN: 500.0000 mL | INTRAVENOUS | Status: DC | PRN

## 2023-11-17 MED ORDER — OXYCODONE-ACETAMINOPHEN 5-325 MG PO TABS: 2.0000 | ORAL_TABLET | ORAL | Status: DC | PRN

## 2023-11-17 NOTE — Progress Notes (Signed)
 Coping well w/ctx.  FHR a40, min to mod variability, no accels, no decels (was tracing mom for a little while).  Ctx q 2 min utes.  AROM at 1930, mod mec stained fluid. Cx 8/100/-1.  Continue expectant mgt.

## 2023-11-17 NOTE — Progress Notes (Signed)
 Labor Progress Note Casey Hall is a 23 y.o. female G1P0 with IUP at [redacted]w[redacted]d by US  presenting for painful contractions and feeling baby move but less than usual.   S:  Casey Hall reports her contractions are getting stronger and more painful. She is pacing around the room and periodically squatting to help loosen her muscles. She is adamant about not having an epidural. Her mother and grandmother are walking around the room with her.   She understands that because she is now 6.5, IV pain medication is less recommended from here on out. She plans on natural pain management for now with the walking, squatting, stretching, and breathing techniques.   O:  BP 113/84   Pulse 92   Temp 97.9 F (36.6 C) (Oral)   Resp 18   LMP 02/05/2023   SpO2 100%   EFM: baseline 135 bpm/ good variability/ + accels/ -- decels  Toco/IUPC: 1.5-3 minutes SVE: Dilation: 6.5 Effacement (%): 100 Station: -3 Exam by:: Casey Montenegro RN Pitocin: None  A/P: Casey Hall is a 23 y.o. female G1P0 with IUP at [redacted]w[redacted]d by US  presenting for painful contractions and feeling baby move but less than usual. 1. Labor: Expectant management 2. FWB: Cat 1 3. Pain: Natural 4. GBS: presumed negative  MOF: Breast milk Contraception: None  Anticipate SVD.  Terrace Compton, MD 5:41 PM

## 2023-11-17 NOTE — MAU Note (Signed)
.  Casey Hall is a 23 y.o. at [redacted]w[redacted]d here in MAU reporting: contractions since 1100 this morning. Denies vaginal bleeding and LOF. States she is feeling baby move less.  Onset of complaint: 10/16 Pain score: 8/10 Vitals:   11/17/23 1516  BP: 127/76  Pulse: 98  Resp: 18  Temp: 99 F (37.2 C)  SpO2: 100%     FHT: 130  Lab orders placed from triage: UA

## 2023-11-17 NOTE — H&P (Addendum)
 OBSTETRIC ADMISSION HISTORY AND PHYSICAL  Casey Hall is a 23 y.o. female G1P0 with IUP at [redacted]w[redacted]d by US  presenting for painful contractions and feeling baby move but less than usual. She reports +FMs, No LOF, no VB, no blurry vision, headaches or peripheral edema, and RUQ pain.  She plans on breast feeding. She does not want birth control. She received her prenatal care at the [place] in [place], [place] . She is active duty Hotel manager. She wanted to deliver here because this is where her family lives and she wanted to be around her immediate family.   Dating: By US  --->  Estimated Date of Delivery: 11/12/23  Sono: unable to obtain; patient reports being told baby's anatomy was normal, was head down, and her placenta was posterior.   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  Past Medical History: Past Medical History:  Diagnosis Date   Asthma    PCOS (polycystic ovarian syndrome)     Past Surgical History: Past Surgical History:  Procedure Laterality Date   CAUTERIZE INNER NOSE Bilateral    implanon       Obstetrical History: OB History     Gravida  1   Para      Term      Preterm      AB      Living         SAB      IAB      Ectopic      Multiple      Live Births             Social History Social History   Socioeconomic History   Marital status: Single    Spouse name: Not on file   Number of children: Not on file   Years of education: Not on file   Highest education level: Not on file  Occupational History   Not on file  Tobacco Use   Smoking status: Never   Smokeless tobacco: Never  Vaping Use   Vaping status: Never Used  Substance and Sexual Activity   Alcohol use: No    Alcohol/week: 0.0 standard drinks of alcohol   Drug use: No   Sexual activity: Never    Birth control/protection: None  Other Topics Concern   Not on file  Social History Narrative   Lives with mom, step-dad,  brother and sister.  In 9th grade at [employer].    Social Drivers of Corporate investment banker Strain: Not on file  Food Insecurity: Not on file  Transportation Needs: Not on file  Physical Activity: Not on file  Stress: Not on file  Social Connections: Not on file    Family History: Family History  Problem Relation Age of Onset   Healthy Mother    Stroke Maternal Grandmother    Heart disease Maternal Grandmother     Allergies: No Known Allergies  Medications Prior to Admission  Medication Sig Dispense Refill Last Dose/Taking   albuterol  (VENTOLIN  HFA) 108 (90 Base) MCG/ACT inhaler Inhale 1-2 puffs into the lungs every 6 (six) hours as needed for wheezing or shortness of breath. 18 g 0    Prenatal Vit-Fe Fumarate-FA (MULTIVITAMIN-PRENATAL) 27-0.8 MG TABS tablet Take 1 tablet by mouth daily at 12 noon.        Review of Systems   All systems reviewed and negative except as stated in HPI  Last menstrual period 02/05/2023. General appearance: alert, cooperative, and no distress Lungs: clear to  auscultation bilaterally Heart: regular rate and rhythm Abdomen: soft, non-tender; bowel sounds normal Pelvic: As below Extremities: Homans sign is negative, no sign of DVT Presentation: cephalic Fetal monitoring Baseline: 140 bpm, Variability: Good {> 6 bpm), Accelerations: Reactive, and Decelerations: Absent Uterine activity: irregular/irritable Dilation: 6.5 Effacement (%): 100 Station: -3 Exam by:: Lorice Montenegro RN   Prenatal labs: ABO, Rh: --/--/O POS (10/15 2223) Antibody:   Rubella:   RPR:    HBsAg:    HIV:    GBS: PRESUMPTIVE NEGATIVE/-- (10/15 2208)    Lab Results  Component Value Date   GBS PRESUMPTIVE NEGATIVE 11/16/2023   GTT: WNL Genetic screening: WNL Anatomy US  WNL  Immunization History  Administered Date(s) Administered   DTaP 12/15/1999, 02/18/2000, 05/19/2000, 02/16/2001, 11/12/2003   H1N1 01/03/2008, 02/17/2008   HIB (PRP-OMP)  12/15/1999, 02/18/2000, 05/19/2000, 11/11/2000   HPV Quadrivalent 06/07/2011, 09/09/2011, 04/04/2012   Hepatitis A 02/02/2005, 02/17/2006   Hepatitis B 1999/10/16, 12/15/1999, 05/19/2000   IPV 12/15/1999, 02/18/2000, 11/11/2000, 11/12/2003   Influenza,inj,Quad PF,6+ Mos 12/12/2017   Influenza-Unspecified 01/08/2004, 12/19/2004, 01/15/2006, 12/17/2006, 01/03/2008, 12/03/2008, 11/18/2009, 11/23/2010, 01/22/2012, 12/13/2012, 11/26/2013   MMR 11/11/2000, 11/12/2003   Meningococcal Conjugate 06/07/2011   Moderna Sars-Covid-2 Vaccination 08/25/2020   PFIZER(Purple Top)SARS-COV-2 Vaccination 08/13/2019, 09/17/2019   Pneumococcal-Unspecified 12/15/1999, 02/18/2000, 05/19/2000, 11/17/2001   Td 09/23/2010   Tdap 09/23/2010   Varicella 11/11/2000, 02/02/2005    Prenatal Transfer Tool  Maternal Diabetes: No Genetic Screening: Normal Maternal Ultrasounds/Referrals: Normal Fetal Ultrasounds or other Referrals:  None Maternal Substance Abuse:  No Significant Maternal Medications:  None Significant Maternal Lab Results: Other: presuming GBS(-) Number of Prenatal Visits:greater than 3 verified prenatal visits Maternal Vaccinations:TDap Other Comments:  None   Results for orders placed or performed during the hospital encounter of 11/16/23 (from the past 24 hours)  Urinalysis, Routine w reflex microscopic -Urine, Clean Catch   Collection Time: 11/16/23  9:28 PM  Result Value Ref Range   Color, Urine YELLOW YELLOW   APPearance HAZY (A) CLEAR   Specific Gravity, Urine 1.020 1.005 - 1.030   pH 5.0 5.0 - 8.0   Glucose, UA NEGATIVE NEGATIVE mg/dL   Hgb urine dipstick NEGATIVE NEGATIVE   Bilirubin Urine NEGATIVE NEGATIVE   Ketones, ur NEGATIVE NEGATIVE mg/dL   Protein, ur NEGATIVE NEGATIVE mg/dL   Nitrite NEGATIVE NEGATIVE   Leukocytes,Ua MODERATE (A) NEGATIVE   RBC / HPF 0-5 0 - 5 RBC/hpf   WBC, UA 11-20 0 - 5 WBC/hpf   Bacteria, UA MANY (A) NONE SEEN   Squamous Epithelial / HPF 0-5 0 - 5  /HPF   Mucus PRESENT    Non Squamous Epithelial 0-5 (A) NONE SEEN  GC/Chlamydia probe amp ([place])not at [facility]   Collection Time: 11/16/23  9:35 PM  Result Value Ref Range   Neisseria Gonorrhea Negative    Chlamydia Negative    Comment Normal Reference Ranger Chlamydia - Negative    Comment      Normal Reference Range Neisseria Gonorrhea - Negative  POCT fern test   Collection Time: 11/16/23  9:44 PM  Result Value Ref Range   POCT Fern Test Negative = intact amniotic membranes   Wet prep, genital   Collection Time: 11/16/23 10:07 PM  Result Value Ref Range   Yeast Wet Prep HPF POC NONE SEEN NONE SEEN   Trich, Wet Prep NONE SEEN NONE SEEN   Clue Cells Wet Prep HPF POC PRESENT (A) NONE SEEN   WBC, Wet Prep HPF POC >=10 (A) <10  Sperm NONE SEEN   Rupture of Membrane (ROM) Plus   Collection Time: 11/16/23 10:07 PM  Result Value Ref Range   Rom Plus NEGATIVE   Group B strep by PCR   Collection Time: 11/16/23 10:08 PM   Specimen: Genital  Result Value Ref Range   Group B strep by PCR PRESUMPTIVE NEGATIVE PRESUMPTIVE NEGATIVE  CBC   Collection Time: 11/16/23 10:23 PM  Result Value Ref Range   WBC 6.9 4.0 - 10.5 K/uL   RBC 4.47 3.87 - 5.11 MIL/uL   Hemoglobin 10.1 (L) 12.0 - 15.0 g/dL   HCT 67.4 (L) 63.9 - 53.9 %   MCV 72.7 (L) 80.0 - 100.0 fL   MCH 22.6 (L) 26.0 - 34.0 pg   MCHC 31.1 30.0 - 36.0 g/dL   RDW 83.4 (H) 88.4 - 84.4 %   Platelets 227 150 - 400 K/uL   nRBC 0.0 0.0 - 0.2 %  Hemoglobin A1c   Collection Time: 11/16/23 10:23 PM  Result Value Ref Range   Hgb A1c MFr Bld 5.0 4.8 - 5.6 %   Mean Plasma Glucose 96.8 mg/dL  ABO/Rh   Collection Time: 11/16/23 10:23 PM  Result Value Ref Range   ABO/RH(D) O POS    No rh immune globuloin      NOT A RH IMMUNE GLOBULIN CANDIDATE, PT RH POSITIVE Performed at [facility], [address] [place], [place] [address]     Patient Active Problem List   Diagnosis Date Noted   Nexplanon  in place 04/01/2018    Bilateral nipple discharge 07/28/2015   Constipation 11/25/2014   PCOS (polycystic ovarian syndrome) 07/09/2014   Vitamin D  deficiency 07/09/2014   History of irregular menstrual bleeding 07/09/2014    Assessment/Plan:  Ulah Olmo is a 23 y.o. G1P0 at [redacted]w[redacted]d here for SOL.   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

## 2023-11-18 ENCOUNTER — Encounter (HOSPITAL_COMMUNITY): Payer: Self-pay | Admitting: Obstetrics and Gynecology

## 2023-11-18 DIAGNOSIS — O480 Post-term pregnancy: Secondary | ICD-10-CM

## 2023-11-18 DIAGNOSIS — Z3A40 40 weeks gestation of pregnancy: Secondary | ICD-10-CM

## 2023-11-18 LAB — RPR: RPR Ser Ql: NONREACTIVE

## 2023-11-18 MED ORDER — ONDANSETRON HCL 4 MG PO TABS: 4.0000 mg | ORAL_TABLET | ORAL | Status: DC | PRN

## 2023-11-18 MED ORDER — ONDANSETRON HCL 4 MG/2ML IJ SOLN: 4.0000 mg | INTRAMUSCULAR | Status: DC | PRN

## 2023-11-18 MED ORDER — DIBUCAINE (PERIANAL) 1 % EX OINT: 1.0000 | TOPICAL_OINTMENT | CUTANEOUS | Status: DC | PRN

## 2023-11-18 MED ORDER — COCONUT OIL OIL: 1.0000 | TOPICAL_OIL | Status: DC | PRN

## 2023-11-18 MED ORDER — TRANEXAMIC ACID-NACL 1000-0.7 MG/100ML-% IV SOLN
INTRAVENOUS | Status: AC
  Filled 2023-11-18: qty 100

## 2023-11-18 MED ORDER — TETANUS-DIPHTH-ACELL PERTUSSIS 5-2-15.5 LF-MCG/0.5 IM SUSP: 0.5000 mL | Freq: Once | INTRAMUSCULAR | Status: DC

## 2023-11-18 MED ORDER — METHYLERGONOVINE MALEATE 0.2 MG PO TABS: 0.2000 mg | ORAL_TABLET | ORAL | Status: DC | PRN

## 2023-11-18 MED ORDER — SENNOSIDES-DOCUSATE SODIUM 8.6-50 MG PO TABS
2.0000 | ORAL_TABLET | ORAL | Status: DC
  Administered 2023-11-18 – 2023-11-19 (×2): 2 via ORAL
  Filled 2023-11-18 (×2): qty 2

## 2023-11-18 MED ORDER — FERROUS SULFATE 325 (65 FE) MG PO TABS
325.0000 mg | ORAL_TABLET | ORAL | Status: DC
  Administered 2023-11-18: 325 mg via ORAL
  Filled 2023-11-18: qty 1

## 2023-11-18 MED ORDER — METHYLERGONOVINE MALEATE 0.2 MG/ML IJ SOLN: 0.2000 mg | INTRAMUSCULAR | Status: DC | PRN

## 2023-11-18 MED ORDER — MEDROXYPROGESTERONE ACETATE 150 MG/ML IM SUSP: 150.0000 mg | INTRAMUSCULAR | Status: DC | PRN

## 2023-11-18 MED ORDER — ACETAMINOPHEN 325 MG PO TABS: 650.0000 mg | ORAL_TABLET | ORAL | Status: DC | PRN

## 2023-11-18 MED ORDER — DIPHENHYDRAMINE HCL 25 MG PO CAPS: 25.0000 mg | ORAL_CAPSULE | Freq: Four times a day (QID) | ORAL | Status: DC | PRN

## 2023-11-18 MED ORDER — IBUPROFEN 600 MG PO TABS
600.0000 mg | ORAL_TABLET | Freq: Four times a day (QID) | ORAL | Status: DC
  Administered 2023-11-18 – 2023-11-19 (×4): 600 mg via ORAL
  Filled 2023-11-18 (×5): qty 1

## 2023-11-18 MED ORDER — WITCH HAZEL-GLYCERIN EX PADS: 1.0000 | MEDICATED_PAD | CUTANEOUS | Status: DC | PRN

## 2023-11-18 MED ORDER — BENZOCAINE-MENTHOL 20-0.5 % EX AERO: 1.0000 | INHALATION_SPRAY | CUTANEOUS | Status: DC | PRN

## 2023-11-18 MED ORDER — SIMETHICONE 80 MG PO CHEW: 80.0000 mg | CHEWABLE_TABLET | ORAL | Status: DC | PRN

## 2023-11-18 MED ORDER — FLEET ENEMA RE ENEM: 1.0000 | ENEMA | Freq: Every day | RECTAL | Status: DC | PRN

## 2023-11-18 MED ORDER — BISACODYL 10 MG RE SUPP: 10.0000 mg | Freq: Every day | RECTAL | Status: DC | PRN

## 2023-11-18 MED ORDER — PRENATAL MULTIVITAMIN CH
1.0000 | ORAL_TABLET | Freq: Every day | ORAL | Status: DC
  Administered 2023-11-18 – 2023-11-19 (×2): 1 via ORAL
  Filled 2023-11-18 (×2): qty 1

## 2023-11-18 MED ORDER — MEASLES, MUMPS & RUBELLA VAC ~~LOC~~ SUSR: 0.5000 mL | Freq: Once | SUBCUTANEOUS | Status: DC

## 2023-11-18 MED ADMIN — Tranexamic Acid-Sodium Chloride IV Soln 1000 MG/100ML-0.7%: 1000 mg | INTRAVENOUS | NDC 80830232901

## 2023-11-18 NOTE — Discharge Summary (Signed)
 Postpartum Discharge Summary  Date of Service updated***     Patient Name: Casey Hall DOB: 2000-10-29 MRN: [id number]  Date of admission: 11/17/2023 Delivery date:11/18/2023 Delivering provider: NEWTON MERING Date of discharge: 11/18/2023  Admitting diagnosis: Normal labor [O80, Z37.9] Intrauterine pregnancy: [redacted]w[redacted]d     Secondary diagnosis:  Principal Problem:   Normal labor Active Problems:   Vacuum-assisted vaginal delivery  Additional problems: ***    Discharge diagnosis: Term Pregnancy Delivered                                              Post partum procedures:{Postpartum procedures:23558} Augmentation: AROM Complications: None  Hospital course: Onset of Labor With Vaginal Delivery      23 y.o. yo G1P0 at [redacted]w[redacted]d was admitted in Active Labor on 11/17/2023. Labor course was complicated by nothing  Membrane Rupture Time/Date: 7:34 PM,11/17/2023  Delivery Method:Vaginal, Spontaneous Operative Delivery:Device used:Kiwi Indication: Fetal indications Episiotomy: None Lacerations:  2nd degree Patient had a postpartum course complicated by ***.  She is ambulating, tolerating a regular diet, passing flatus, and urinating well. Patient is discharged home in stable condition on 11/18/23.  Newborn Data: Birth date:11/18/2023 Birth time:12:07 AM Gender:Female Living status:Living Apgars:8 ,9  Weight:   Magnesium Sulfate received: No BMZ received: No Rhophylac:N/A MMR:{MMR:30440033} T-DaP:{Tdap:23962} Flu: {Qol:76036} RSV Vaccine received: No Transfusion:{Transfusion received:30440034}  Immunizations received: Immunization History  Administered Date(s) Administered   DTaP 12/15/1999, 02/18/2000, 05/19/2000, 02/16/2001, 11/12/2003   H1N1 01/03/2008, 02/17/2008   HIB (PRP-OMP) 12/15/1999, 02/18/2000, 05/19/2000, 11/11/2000   HPV Quadrivalent 06/07/2011, 09/09/2011, 04/04/2012   Hepatitis A 02/02/2005, 02/17/2006   Hepatitis B 1999/04/23,  12/15/1999, 05/19/2000   IPV 12/15/1999, 02/18/2000, 11/11/2000, 11/12/2003   Influenza,inj,Quad PF,6+ Mos 12/12/2017   Influenza-Unspecified 01/08/2004, 12/19/2004, 01/15/2006, 12/17/2006, 01/03/2008, 12/03/2008, 11/18/2009, 11/23/2010, 01/22/2012, 12/13/2012, 11/26/2013   MMR 11/11/2000, 11/12/2003   Meningococcal Conjugate 06/07/2011   Moderna Sars-Covid-2 Vaccination 08/25/2020   PFIZER(Purple Top)SARS-COV-2 Vaccination 08/13/2019, 09/17/2019   Pneumococcal-Unspecified 12/15/1999, 02/18/2000, 05/19/2000, 11/17/2001   Td 09/23/2010   Tdap 09/23/2010   Varicella 11/11/2000, 02/02/2005    Physical exam  Vitals:   11/18/23 0026 11/18/23 0027 11/18/23 0030 11/18/23 0045  BP: 128/80  127/68 120/76  Pulse: 100  88 89  Resp:   15 16  Temp:  98.3 F (36.8 C)    TempSrc:  Oral    SpO2:       General: {Exam; general:21111117} Lochia: {Desc; appropriate/inappropriate:30686::appropriate} Uterine Fundus: {Desc; firm/soft:30687} Incision: {Exam; incision:21111123} DVT Evaluation: {Exam; dvt:2111122} Labs: Lab Results  Component Value Date   WBC 8.7 11/17/2023   HGB 11.0 (L) 11/17/2023   HCT 36.0 11/17/2023   MCV 73.3 (L) 11/17/2023   PLT 240 11/17/2023      Latest Ref Rng & Units 08/13/2016   10:18 AM  CMP  Glucose 65 - 99 mg/dL 83   BUN 7 - 20 mg/dL 10   Creatinine 9.49 - 1.00 mg/dL 9.29   Sodium 864 - 853 mmol/L 138   Potassium 3.8 - 5.1 mmol/L 4.3   Chloride 98 - 110 mmol/L 105   CO2 20 - 31 mmol/L 23   Calcium 8.9 - 10.4 mg/dL 9.5   Total Protein 6.3 - 8.2 g/dL 7.8   Total Bilirubin 0.2 - 1.1 mg/dL 0.5   Alkaline Phos 47 - 176 U/L 76   AST 12 - 32 U/L 20  ALT 5 - 32 U/L 11    Edinburgh Score:     No data to display         No data recorded  After visit meds:  Allergies as of 11/18/2023   No Known Allergies   Med Rec must be completed prior to using this [facility]***        Discharge home in stable condition Infant Feeding: {Baby  feeding:23562} Infant Disposition:{CHL IP OB HOME WITH FNUYZM:76418} Discharge instruction: per After Visit Summary and Postpartum booklet. Activity: Advance as tolerated. Pelvic rest for 6 weeks.  Diet: {OB ipzu:78888878} Future Appointments: Future Appointments  Date Time Provider Department Center  11/21/2023  8:55 AM Wallace Joesph LABOR, PA [facility]   Follow up Visit:  To schedule in [facility]   11/18/2023 Cathlean Ely, CNM

## 2023-11-18 NOTE — Lactation Note (Signed)
 This note was copied from a baby's chart. Lactation Consultation Note  Patient Name: Boy Eulogia Dismore Unijb'd Date: 11/18/2023 Age:24 hours   RN stated mom is really tired please see her later.  Maternal Data    Feeding    LATCH Score Latch: Grasps breast easily, tongue down, lips flanged, rhythmical sucking.  Audible Swallowing: None  Type of Nipple: Everted at rest and after stimulation  Comfort (Breast/Nipple): Soft / non-tender  Hold (Positioning): Assistance needed to correctly position infant at breast and maintain latch.  LATCH Score: 7   Lactation Tools Discussed/Used    Interventions Interventions: Breast feeding basics reviewed;Assisted with latch;Skin to skin;Support pillows;Expressed milk  Discharge    Consult Status      Orlene Salmons G 11/18/2023, 3:52 AM

## 2023-11-18 NOTE — Progress Notes (Signed)
 Delivery was performed by CNM Cresenzo. She was called to another delivery and I performed delivery of the placenta and repair of 2nd degree perineal laceration. Placenta delivered without difficulty. Second degree laceration and right labial laceration was repaired in the usual fashion with 2-0 and 3-0 vicryl after injecting 10 ml of 1% lidocaine  for local anesthetic. TXA was given.  Rollo ONEIDA Bring, MD, FACOG Obstetrician & Gynecologist, [facility] for [facility], [facility]

## 2023-11-19 LAB — RUBELLA SCREEN: Rubella: 1.39 {index} (ref >0.99)

## 2023-11-19 MED ORDER — IBUPROFEN 600 MG PO TABS: 600.0000 mg | ORAL_TABLET | Freq: Four times a day (QID) | ORAL | 0 refills | Status: AC | PRN

## 2023-11-19 MED ORDER — ACETAMINOPHEN 500 MG PO TABS: 1000.0000 mg | ORAL_TABLET | Freq: Four times a day (QID) | ORAL | 2 refills | Status: AC | PRN

## 2023-11-19 MED ORDER — FERROUS SULFATE 325 (65 FE) MG PO TABS: 325.0000 mg | ORAL_TABLET | ORAL | 3 refills | Status: AC

## 2023-11-19 MED ORDER — SENNOSIDES-DOCUSATE SODIUM 8.6-50 MG PO TABS: 2.0000 | ORAL_TABLET | Freq: Every evening | ORAL | 2 refills | Status: AC | PRN

## 2023-11-19 NOTE — Progress Notes (Signed)
 Patient ID: Casey Hall, female   DOB: 2000-11-10, 23 y.o.   MRN: [id number]  Post Partum Day One:S/P VD  Subjective: Patient up ad lib, denies syncope or dizziness. Reports consuming regular diet without issues and denies N/V. Denies issues with urination and no issues with vaginal bleeding.  Patient is breastfeeding and reports going good, but a little sore.  Desires no method for postpartum contraception.  Pain is being appropriately managed with use of ibuprofen .  Objective: Vitals:   11/18/23 1130 11/18/23 1530 11/18/23 2030 11/19/23 0500  BP: 112/70 116/72 114/70 122/83  Pulse: 82 84 96 86  Resp: 18 18 17 17   Temp: 98.2 F (36.8 C) 98.4 F (36.9 C) 98.3 F (36.8 C) 97.8 F (36.6 C)  TempSrc: Oral Oral Oral Oral  SpO2:   100% 100%   Recent Labs    11/16/23 2223 11/17/23 1533  HGB 10.1* 11.0*  HCT 32.5* 36.0    Physical Exam:  General: alert, cooperative, and no distress Mood/Affect: Appropriate/Appropriate Lungs: clear to auscultation, no wheezes, rales or rhonchi, symmetric air entry.  Heart: normal rate and regular rhythm. Breast: not examined. Abdomen:  + bowel sounds, Soft Uterine Fundus: firm, U/-1 Lochia: appropriate Laceration: Not Assessed Skin: Warm, Dry DVT Evaluation: No evidence of DVT seen on physical exam. Calf/Ankle edema is present.  Assessment S/P Vaginal Delivery-Day One Normal [employer] BreastFeeding  Plan: -Continue iron supplement and plan for every other day dosing once home.  -Discussed discharge tomorrow. -Patient to follow up with [facility] and instructed to call if no appt made by Tuesday Nov 22, 2023. -Continue current care. -L&D team to be updated on patient status.   Harlene LITTIE Duncans, MSN, CNM 11/19/2023, 6:23 AM

## 2023-11-20 DIAGNOSIS — Z3493 Encounter for supervision of normal pregnancy, unspecified, third trimester: Secondary | ICD-10-CM | POA: Insufficient documentation

## 2023-11-21 ENCOUNTER — Encounter: Admitting: Family Medicine

## 2023-11-22 ENCOUNTER — Encounter: Admitting: Physician Assistant

## 2023-12-02 ENCOUNTER — Telehealth (HOSPITAL_COMMUNITY): Payer: Self-pay | Admitting: *Deleted

## 2023-12-02 NOTE — Telephone Encounter (Signed)
 12/02/2023  Name: Casey Hall MRN: [id number] DOB: [birth year]  Reason for Call:  Transition of Care Hospital Discharge Call  Contact Status: Patient Contact Status: Complete  Language assistant needed: Interpreter Mode: Interpreter Not Needed        Follow-Up Questions: Do You Have Any Concerns About Your Health As You Heal From Delivery?: Yes What Concerns Do You Have About Your Health?: Patient reports that she is having chills 1-2 times daily, otherwise she feels well. Denies fever, sweating, or pain. Advised that postpartum chills can be caused by hormonal changes and that she can call or send a message to her provider via MyChart for further advice if desired. Do You Have Any Concerns About Your Infants Health?: No  Edinburgh Postnatal Depression Scale:  In the Past 7 Days: I have been able to laugh and see the funny side of things.: As much as I always could I have looked forward with enjoyment to things.: As much as I ever did I have blamed myself unnecessarily when things went wrong.: Not very often I have been anxious or worried for no good reason.: Hardly ever I have felt scared or panicky for no good reason.: No, not much Things have been getting on top of me.: No, I have been coping as well as ever I have been so unhappy that I have had difficulty sleeping.: Not very often I have felt sad or miserable.: No, not at all I have been so unhappy that I have been crying.: No, never The thought of harming myself has occurred to me.: Never Edinburgh Postnatal Depression Scale Total: 4  PHQ2-9 Depression Scale:     Discharge Follow-up: Edinburgh score requires follow up?: No Patient was advised of the following resources:: Support Group, Breastfeeding Support Group  Post-discharge interventions: Reviewed Newborn Safe Sleep Practices  Mliss Sieve, RN 12/02/2023 13:30

## 2023-12-19 ENCOUNTER — Ambulatory Visit: Payer: Self-pay

## 2023-12-19 NOTE — Telephone Encounter (Signed)
 Pt does not have PCP or OBGYN. Pt going to [facility] now, advised that they may send her to the ED if she needs a higher level of evaluation and/or interventions. Pt verbalized understanding. Encouraged to call to establish care with a PCP after. States that her infant does have a pediatrician already.  FYI Only or Action Required?: FYI only for provider: going to UC and/or ED.  Patient was last seen in primary care on Not established.  Called Nurse Triage reporting Dizziness.  Symptoms began several weeks ago.  Interventions attempted: Rest, hydration, or home remedies.  Symptoms are: unchanged.  Triage Disposition: See Physician Within 24 Hours  Patient/caregiver understands and will follow disposition?: Yes  Reason for Disposition  [1] MODERATE dizziness (e.g., interferes with normal activities) AND [2] has NOT been evaluated by doctor (or NP/PA) for this  (Exception: Dizziness caused by heat exposure, sudden standing, or poor fluid intake.)  Answer Assessment - Initial Assessment Questions Pt reports delivered baby via vaginal delivery 4 weeks ago. Denies complications other than being told she was anemic. Stated hospital was supposed to rx iron but pharmacy told her nothing was ordered.  Has been experiencing lightheadedness x 2 weeks. States experiencing brown discharge but no frank vaginal bleeding. Denies any other complaints at this time and reports that all is well with the infant.  1. DESCRIPTION: Describe your dizziness.     Lightheadedness 2. LIGHTHEADED: Do you feel lightheaded? (e.g., somewhat faint, woozy, weak upon standing)     With movement/position changes 3. VERTIGO: Do you feel like either you or the room is spinning or tilting? (i.e., vertigo)     Denies 4. SEVERITY: How bad is it?  Do you feel like you are going to faint? Can you stand and walk?     States she can walk unassisted 5. ONSET:  When did the dizziness begin?     2 weeks 6. AGGRAVATING  FACTORS: Does anything make it worse? (e.g., standing, change in head position)     Position changes  Protocols used: Dizziness - Lightheadedness-A-AH  Copied from CRM #[id number]. Topic: Clinical - Red Word Triage >> Dec 19, 2023  1:00 PM Delon HERO wrote: Red Word that prompted transfer to Nurse Triage: Patient is calling to report dizziness for 2 weeks. 4 week post partum.

## 2024-01-08 ENCOUNTER — Ambulatory Visit (HOSPITAL_COMMUNITY)
Admission: EM | Admit: 2024-01-08 | Discharge: 2024-01-08 | Disposition: A | Discharge status: Home / Self Care | Attending: Internal Medicine | Admitting: Internal Medicine

## 2024-01-08 ENCOUNTER — Encounter (HOSPITAL_COMMUNITY): Payer: Self-pay

## 2024-01-08 DIAGNOSIS — R509 Fever, unspecified: Secondary | ICD-10-CM | POA: Diagnosis not present

## 2024-01-08 DIAGNOSIS — E860 Dehydration: Secondary | ICD-10-CM

## 2024-01-08 DIAGNOSIS — R000 Tachycardia, unspecified: Secondary | ICD-10-CM

## 2024-01-08 DIAGNOSIS — R42 Dizziness and giddiness: Secondary | ICD-10-CM

## 2024-01-08 DIAGNOSIS — O909 Complication of the puerperium, unspecified: Secondary | ICD-10-CM

## 2024-01-08 LAB — CBC
HCT: 37.8 % (ref 36.0–46.0)
Hemoglobin: 12 g/dL (ref 12.0–15.0)
MCH: 23.7 pg — ABNORMAL LOW (ref 26.0–34.0)
MCHC: 31.7 g/dL (ref 30.0–36.0)
MCV: 74.7 fL — ABNORMAL LOW (ref 80.0–100.0)
Platelets: 265 K/uL (ref 150–400)
RBC: 5.06 MIL/uL (ref 3.87–5.11)
RDW: 20.1 % — ABNORMAL HIGH (ref 11.5–15.5)
WBC: 7 K/uL (ref 4.0–10.5)
nRBC: 0 % (ref 0.0–0.2)

## 2024-01-08 LAB — POCT URINE DIPSTICK
Bilirubin, UA: NEGATIVE
Blood, UA: NEGATIVE
Glucose, UA: NEGATIVE mg/dL
Ketones, POC UA: NEGATIVE mg/dL
Nitrite, UA: NEGATIVE
POC PROTEIN,UA: NEGATIVE
Spec Grav, UA: 1.025 (ref 1.010–1.025)
Urobilinogen, UA: 0.2 U/dL
pH, UA: 5 (ref 5.0–8.0)

## 2024-01-08 LAB — COMPREHENSIVE METABOLIC PANEL WITH GFR
ALT: 27 U/L (ref 0–44)
AST: 28 U/L (ref 15–41)
Albumin: 3.8 g/dL (ref 3.5–5.0)
Alkaline Phosphatase: 88 U/L (ref 38–126)
Anion gap: 12 (ref 5–15)
BUN: 12 mg/dL (ref 6–20)
CO2: 19 mmol/L — ABNORMAL LOW (ref 22–32)
Calcium: 8.5 mg/dL — ABNORMAL LOW (ref 8.9–10.3)
Chloride: 104 mmol/L (ref 98–111)
Creatinine, Ser: 0.53 mg/dL (ref 0.44–1.00)
GFR, Estimated: >60 mL/min (ref >60)
Glucose, Bld: 133 mg/dL — ABNORMAL HIGH (ref 70–99)
Potassium: 3.4 mmol/L — ABNORMAL LOW (ref 3.5–5.1)
Sodium: 135 mmol/L (ref 135–145)
Total Bilirubin: 0.4 mg/dL (ref 0.0–1.2)
Total Protein: 7.4 g/dL (ref 6.5–8.1)

## 2024-01-08 LAB — POC COVID19/FLU A&B COMBO
Covid Antigen, POC: NEGATIVE
Influenza A Antigen, POC: NEGATIVE
Influenza B Antigen, POC: NEGATIVE

## 2024-01-08 LAB — POCT URINE PREGNANCY: Preg Test, Ur: NEGATIVE

## 2024-01-08 MED ORDER — ACETAMINOPHEN 325 MG PO TABS
ORAL_TABLET | ORAL | Status: AC
  Filled 2024-01-08: qty 3

## 2024-01-08 MED ORDER — SODIUM CHLORIDE 0.9 % IV BOLUS
1000.0000 mL | Freq: Once | INTRAVENOUS | Status: AC
  Administered 2024-01-08: 1000 mL via INTRAVENOUS

## 2024-01-08 MED ORDER — KETOROLAC TROMETHAMINE 15 MG/ML IJ SOLN
15.0000 mg | Freq: Once | INTRAMUSCULAR | Status: AC
  Administered 2024-01-08: 15 mg via INTRAVENOUS

## 2024-01-08 MED ORDER — KETOROLAC TROMETHAMINE 30 MG/ML IJ SOLN
INTRAMUSCULAR | Status: AC
  Filled 2024-01-08: qty 1

## 2024-01-08 MED ORDER — ACETAMINOPHEN 325 MG PO TABS: 650.0000 mg | ORAL_TABLET | Freq: Once | ORAL | Status: DC

## 2024-01-08 MED ORDER — ACETAMINOPHEN 325 MG PO TABS
975.0000 mg | ORAL_TABLET | Freq: Once | ORAL | Status: AC
  Administered 2024-01-08: 975 mg via ORAL

## 2024-01-08 NOTE — ED Notes (Signed)
 Pt IV fluids still running without any difficulties. Pt states that she can't tell any difference of the IV Toradol  that she got.

## 2024-01-08 NOTE — ED Provider Notes (Cosign Needed Addendum)
 [facility]    CSN: [id number] Arrival date & time: 01/08/24  1429      History   Chief Complaint Chief Complaint  Patient presents with   Dizziness   Headache    HPI Casey Hall is a 23 y.o. female.   Casey Hall is a 23 y.o. female who is currently 6 weeks postpartum presenting for chief complaint of fever, chills, postural dizziness, generalized malaise, and generalized headache that started this morning upon waking.  Headache is described as like a band wrapping around the forehead and into the back of the head.  Headache is currently a 10 on a scale of 0-10 and described as a throbbing sensation to the head that is worsened by bright lights.  Denies history of migraines.  Denies phonophobia and visual disturbances. She has had fever/chills today but is unsure of max temp at home.  Currently febrile at 102.9 in clinic without recent antipyretic use. She feels dizzy when she changes positions.  She denies recent sick contacts with similar symptoms, cough, nasal congestion, ear pain, sore throat, and rashes. No urinary symptoms or vaginal discharge/vaginal bleeding or dyspareunia. No pelvic pain, abdominal pain, low back pain, flank pain, gross hematuria.  No neck pain, midline cervical, thoracic, or lumbar pain. Denies chest pain, shortness of breath, leg swelling, and heart palpitations.  No recent syncope. She is breast-feeding and denies breast pain, redness/swelling to the breasts, and changes in her milk supply. She does not have other children who attend daycare where she could have been exposed to viral illness.  She has not attempted treatment of symptoms at home.   Dizziness Associated symptoms: headaches   Headache Associated symptoms: dizziness     Past Medical History:  Diagnosis Date   Asthma    PCOS (polycystic ovarian syndrome)     Patient Active Problem List   Diagnosis Date Noted   Encounter for supervision of  low-risk pregnancy in third trimester 11/20/2023   Bilateral nipple discharge 07/28/2015   PCOS (polycystic ovarian syndrome) 07/09/2014   Vitamin D  deficiency 07/09/2014    Past Surgical History:  Procedure Laterality Date   CAUTERIZE INNER NOSE Bilateral    implanon       OB History     Gravida  1   Para  1   Term  1   Preterm      AB      Living  1      SAB      IAB      Ectopic      Multiple  0   Live Births  1            Home Medications    Prior to Admission medications   Medication Sig Start Date End Date Taking? Authorizing Provider  Prenatal Vit-Fe Fumarate-FA (MULTIVITAMIN-PRENATAL) 27-0.8 MG TABS tablet Take 1 tablet by mouth daily at 12 noon.   Yes [provider]  acetaminophen  (TYLENOL ) 500 MG tablet Take 2 tablets (1,000 mg total) by mouth every 6 (six) hours as needed for mild pain (pain score 1-3) or headache (for pain scale < 4). 11/19/23   Anyanwu, Ugonna A, MD  albuterol  (VENTOLIN  HFA) 108 (90 Base) MCG/ACT inhaler Inhale 1-2 puffs into the lungs every 6 (six) hours as needed for wheezing or shortness of breath. 07/03/23   Allwardt, Alyssa M, PA-C  ferrous sulfate  325 (65 FE) MG tablet Take 1 tablet (325 mg total) by mouth every other day. 11/20/23  Anyanwu, Ugonna A, MD  ibuprofen  (ADVIL ) 600 MG tablet Take 1 tablet (600 mg total) by mouth every 6 (six) hours as needed for mild pain (pain score 1-3), moderate pain (pain score 4-6) or cramping. 11/19/23   Anyanwu, Ugonna A, MD  senna-docusate (SENOKOT-S) 8.6-50 MG tablet Take 2 tablets by mouth at bedtime as needed for mild constipation or moderate constipation. 11/19/23   Herchel Gloris LABOR, MD    Family History Family History  Problem Relation Age of Onset   Healthy Mother    Stroke Maternal Grandmother    Heart disease Maternal Grandmother     Social History Social History   Tobacco Use   Smoking status: Never   Smokeless tobacco: Never  Vaping Use   Vaping status:  Never Used  Substance Use Topics   Alcohol use: No    Alcohol/week: 0.0 standard drinks of alcohol   Drug use: No     Allergies   Patient has no known allergies.   Review of Systems Review of Systems  Neurological:  Positive for dizziness and headaches.  Per HPI   Physical Exam Triage Vital Signs ED Triage Vitals  Encounter Vitals Group     BP 01/08/24 1630 118/77     Girls Systolic BP Percentile --      Girls Diastolic BP Percentile --      Boys Systolic BP Percentile --      Boys Diastolic BP Percentile --      Pulse Rate 01/08/24 1630 (!) 109     Resp 01/08/24 1630 18     Temp 01/08/24 1630 (!) 102.6 F (39.2 C)     Temp Source 01/08/24 1630 Oral     SpO2 01/08/24 1630 96 %     Weight --      Height 01/08/24 1630 5' 2.21 (1.58 m)     Head Circumference --      Peak Flow --      Pain Score 01/08/24 1629 10     Pain Loc --      Pain Education --      Exclude from Growth Chart --    Orthostatic VS for the past 24 hrs:  BP- Lying Pulse- Lying BP- Sitting Pulse- Sitting BP- Standing at 0 minutes Pulse- Standing at 0 minutes  01/08/24 1731 110/70 102 112/77 112 112/78 118    Updated Vital Signs BP 118/77 (BP Location: Left Arm)   Pulse (!) 109   Temp 98.9 F (37.2 C) (Oral)   Resp 18   Ht 5' 2.21 (1.58 m)   LMP 02/05/2023   SpO2 96%   Breastfeeding Yes   BMI 24.74 kg/m   Visual Acuity Right Eye Distance:   Left Eye Distance:   Bilateral Distance:    Right Eye Near:   Left Eye Near:    Bilateral Near:     Physical Exam Vitals and nursing note reviewed.  Constitutional:      Appearance: She is ill-appearing. She is not toxic-appearing.  HENT:     Head: Normocephalic and atraumatic.     Right Ear: Hearing, tympanic membrane, ear canal and external ear normal.     Left Ear: Hearing, tympanic membrane, ear canal and external ear normal.     Nose: Nose normal.     Mouth/Throat:     Lips: Pink.     Mouth: Mucous membranes are moist. No injury or  oral lesions.     Dentition: Normal dentition.     Tongue:  No lesions.     Pharynx: Oropharynx is clear. Uvula midline. No pharyngeal swelling, oropharyngeal exudate, posterior oropharyngeal erythema, uvula swelling or postnasal drip.     Tonsils: No tonsillar exudate.  Eyes:     General: Lids are normal. Vision grossly intact. Gaze aligned appropriately.     Extraocular Movements: Extraocular movements intact.     Conjunctiva/sclera: Conjunctivae normal.  Neck:     Trachea: Trachea and phonation normal.     Meningeal: Brudzinski's sign and Kernig's sign absent.     Comments: Nontender to palpation over the C, T, or L-spine Cardiovascular:     Rate and Rhythm: Regular rhythm. Tachycardia present.     Heart sounds: Normal heart sounds, S1 normal and S2 normal.     Comments: +2 bilateral radial pulses.  +2 bilateral dorsalis pedis pulses. Pulmonary:     Effort: Pulmonary effort is normal. No respiratory distress.     Breath sounds: Normal breath sounds and air entry.  Abdominal:     General: Bowel sounds are normal.     Palpations: Abdomen is soft.     Tenderness: There is no abdominal tenderness. There is no right CVA tenderness, left CVA tenderness or guarding.  Musculoskeletal:     Cervical back: Neck supple. No rigidity.     Right lower leg: No edema.     Left lower leg: No edema.  Lymphadenopathy:     Cervical: No cervical adenopathy.  Skin:    General: Skin is warm and dry.     Capillary Refill: Capillary refill takes less than 2 seconds.     Findings: No rash.  Neurological:     General: No focal deficit present.     Mental Status: She is alert and oriented to person, place, and time. Mental status is at baseline.     GCS: GCS eye subscore is 4. GCS verbal subscore is 5. GCS motor subscore is 6.     Cranial Nerves: Cranial nerves 2-12 are intact. No dysarthria or facial asymmetry.     Sensory: Sensation is intact.     Motor: Motor function is intact. No weakness, tremor,  abnormal muscle tone or pronator drift.     Coordination: Coordination is intact. Romberg sign negative. Coordination normal. Finger-Nose-Finger Test normal.     Gait: Gait is intact.     Comments: Strength and sensation intact to bilateral upper and lower extremities (5/5). Moves all 4 extremities with normal coordination voluntarily. Non-focal neuro exam.   Psychiatric:        Mood and Affect: Mood normal.        Speech: Speech normal.        Behavior: Behavior normal.        Thought Content: Thought content normal.        Judgment: Judgment normal.      UC Treatments / Results  Labs (all labs ordered are listed, but only abnormal results are displayed) Labs Reviewed  POCT URINE DIPSTICK - Abnormal; Notable for the following components:      Result Value   Clarity, UA turbid (*)    Leukocytes, UA Small (1+) (*)    All other components within normal limits  POC COVID19/FLU A&B COMBO - Normal  POCT URINE PREGNANCY - Normal  COMPREHENSIVE METABOLIC PANEL WITH GFR  CBC    EKG   Radiology No results found.  Procedures Procedures (including critical care time)  Medications Ordered in UC Medications  acetaminophen  (TYLENOL ) tablet 975 mg (975 mg Oral Given  01/08/24 1647)  sodium chloride  0.9 % bolus 1,000 mL (0 mLs Intravenous Stopped 01/08/24 1851)  ketorolac  (TORADOL ) 15 MG/ML injection 15 mg (15 mg Intravenous Given 01/08/24 1807)    Initial Impression / Assessment and Plan / UC Course  I have reviewed the triage vital signs and the nursing notes.  Pertinent labs & imaging results that were available during my care of the patient were reviewed by me and considered in my medical decision making (see chart for details).   1.  Fever, dizziness, tachycardia, postpartum problem, dehydration Is unclear what is causing patient's fever, dizziness, and headache.  She appears dehydrated on exam with postural tachycardia on orthostatic vital signs.  Blood pressure stable in the  110s over 70s.   There are no overt signs of infection on history or exam.  No signs of endometritis, though this would be rare at 6 weeks postpartum.  Further considered viral syndrome, though she's not experiencing viral syndrome signs/symptoms.  Urinalysis is unremarkable for signs of UTI and she is not experiencing urinary symptoms. Kernig's and Brudzinski's sign's are negative, low suspicion for acute meningitis.  No symptoms of mastitis. Low suspicion for pulmonary embolism/DVT/clotting abnormality.  For now, we will treat dehydration with 1 L IV fluid bolus.  Bolus given, she reports significant improvement in headache after ketorolac  15 mg IV and Tylenol  p.o. Dizziness completely resolved after IV fluids.  Headache reduced to a 4/10.   Advised to continue pushing fluids to stay well-hydrated at home.  CBC and CMP are pending to evaluate for progressing anemia contributing to dizziness versus electrolyte abnormality at [facility]. Staff will call if labs are abnormal. Advised to pick up iron supplements that were prescribed to her at time of her delivery.  Follow-up with OB/GYN/PCP in 3 to 4 days to ensure that she is feeling better. Return precautions discussed for above differentials (endometritis, mastitis, viral syndrome, etc).   Counseled patient on potential for adverse effects with medications prescribed/recommended today, strict ER and return-to-clinic precautions discussed, patient verbalized understanding.    Final Clinical Impressions(s) / UC Diagnoses   Final diagnoses:  Fever, unspecified  Dizziness  Tachycardia  Postpartum problem  Dehydration     Discharge Instructions      Is unclear what is causing your fever. COVID-19 and influenza testing is negative.  Watch for new signs of viral illness over the next few days such as cough, nasal congestion, sore throat, etc.  Watch for signs of mastitis which is an infection to the breast causing redness, swelling,  and soreness to the breast tissue.  Watch for signs of urinary tract infection such as burning with urination, urinary frequency, and urinary urgency.  If you develop pelvic pain with high fever, I need you to go to the nearest emergency department for further workup and evaluation.  We gave you IV fluids today, this will help with hydration.  Still keep drinking plenty of fluids to stay well-hydrated especially while breast-feeding.  Please schedule a follow-up appointment with your primary care provider to be seen in the next 3 to 4 days to ensure that your symptoms have improved.  Do not take any ibuprofen  until tomorrow morning since I gave you medication through your IV tonight.  You may take Tylenol  1000 mg every 6 hours as needed for headache. You may take ibuprofen  600 mg every 6 hours as needed for headache starting tomorrow.  Labs will come back in the next 12 to 24 hours, staff will call if labs  are abnormal.  If you develop any of the signs/symptoms listed above in the next few days, please return to urgent care for reevaluation and further testing.     ED Prescriptions   None    PDMP not reviewed this encounter.   Enedelia Dorna HERO, FNP 01/08/24 1925    Enedelia Dorna HERO, FNP 01/08/24 1928

## 2024-01-08 NOTE — ED Triage Notes (Signed)
 Patient states she is 6 weeks post-partum. During labor lost a lot of blood and was prescribed iron but never picked this up.   Presenting with dizziness, headaches, and chills onset this morning around 1100. Denies any other sick symptoms. Denies any known exposure or anyone with similar symptoms. Patient is currently breast feeding.   Has not taken any meds for her symptoms.

## 2024-01-08 NOTE — Discharge Instructions (Addendum)
 Is unclear what is causing your fever. COVID-19 and influenza testing is negative.  Watch for new signs of viral illness over the next few days such as cough, nasal congestion, sore throat, etc.  Watch for signs of mastitis which is an infection to the breast causing redness, swelling, and soreness to the breast tissue.  Watch for signs of urinary tract infection such as burning with urination, urinary frequency, and urinary urgency.  If you develop pelvic pain with high fever, I need you to go to the nearest emergency department for further workup and evaluation.  We gave you IV fluids today, this will help with hydration.  Still keep drinking plenty of fluids to stay well-hydrated especially while breast-feeding.  Please schedule a follow-up appointment with your primary care provider to be seen in the next 3 to 4 days to ensure that your symptoms have improved.  Do not take any ibuprofen  until tomorrow morning since I gave you medication through your IV tonight.  You may take Tylenol  1000 mg every 6 hours as needed for headache. You may take ibuprofen  600 mg every 6 hours as needed for headache starting tomorrow.  Labs will come back in the next 12 to 24 hours, staff will call if labs are abnormal.  If you develop any of the signs/symptoms listed above in the next few days, please return to urgent care for reevaluation and further testing.

## 2024-01-09 ENCOUNTER — Emergency Department (HOSPITAL_COMMUNITY)

## 2024-01-09 ENCOUNTER — Ambulatory Visit (HOSPITAL_COMMUNITY): Payer: Self-pay

## 2024-01-09 ENCOUNTER — Emergency Department (HOSPITAL_COMMUNITY)
Admission: EM | Admit: 2024-01-09 | Discharge: 2024-01-09 | Disposition: A | Discharge status: Home / Self Care | Attending: Emergency Medicine | Admitting: Emergency Medicine

## 2024-01-09 ENCOUNTER — Encounter (HOSPITAL_COMMUNITY): Payer: Self-pay | Admitting: *Deleted

## 2024-01-09 ENCOUNTER — Other Ambulatory Visit: Payer: Self-pay

## 2024-01-09 DIAGNOSIS — J111 Influenza due to unidentified influenza virus with other respiratory manifestations: Secondary | ICD-10-CM

## 2024-01-09 LAB — COMPREHENSIVE METABOLIC PANEL WITH GFR
ALT: 30 U/L (ref 0–44)
AST: 31 U/L (ref 15–41)
Albumin: 3.3 g/dL — ABNORMAL LOW (ref 3.5–5.0)
Alkaline Phosphatase: 91 U/L (ref 38–126)
Anion gap: 11 (ref 5–15)
BUN: 20 mg/dL (ref 6–20)
CO2: 19 mmol/L — ABNORMAL LOW (ref 22–32)
Calcium: 8.5 mg/dL — ABNORMAL LOW (ref 8.9–10.3)
Chloride: 109 mmol/L (ref 98–111)
Creatinine, Ser: 0.67 mg/dL (ref 0.44–1.00)
GFR, Estimated: >60 mL/min (ref >60)
Glucose, Bld: 104 mg/dL — ABNORMAL HIGH (ref 70–99)
Potassium: 3.2 mmol/L — ABNORMAL LOW (ref 3.5–5.1)
Sodium: 139 mmol/L (ref 135–145)
Total Bilirubin: 0.5 mg/dL (ref 0.0–1.2)
Total Protein: 6.7 g/dL (ref 6.5–8.1)

## 2024-01-09 LAB — CBC WITH DIFFERENTIAL/PLATELET
Abs Immature Granulocytes: 0.01 K/uL (ref 0.00–0.07)
Basophils Absolute: 0 K/uL (ref 0.0–0.1)
Basophils Relative: 1 %
Eosinophils Absolute: 0.1 K/uL (ref 0.0–0.5)
Eosinophils Relative: 2 %
HCT: 34.9 % — ABNORMAL LOW (ref 36.0–46.0)
Hemoglobin: 10.9 g/dL — ABNORMAL LOW (ref 12.0–15.0)
Immature Granulocytes: 0 %
Lymphocytes Relative: 20 %
Lymphs Abs: 1.1 K/uL (ref 0.7–4.0)
MCH: 23.9 pg — ABNORMAL LOW (ref 26.0–34.0)
MCHC: 31.2 g/dL (ref 30.0–36.0)
MCV: 76.4 fL — ABNORMAL LOW (ref 80.0–100.0)
Monocytes Absolute: 0.4 K/uL (ref 0.1–1.0)
Monocytes Relative: 7 %
Neutro Abs: 3.8 K/uL (ref 1.7–7.7)
Neutrophils Relative %: 70 %
Platelets: 225 K/uL (ref 150–400)
RBC: 4.57 MIL/uL (ref 3.87–5.11)
RDW: 20.1 % — ABNORMAL HIGH (ref 11.5–15.5)
WBC: 5.5 K/uL (ref 4.0–10.5)
nRBC: 0 % (ref 0.0–0.2)

## 2024-01-09 LAB — I-STAT CG4 LACTIC ACID, ED: Lactic Acid, Venous: 0.6 mmol/L (ref 0.5–1.9)

## 2024-01-09 LAB — URINALYSIS, W/ REFLEX TO CULTURE (INFECTION SUSPECTED)
Bacteria, UA: NONE SEEN
Bilirubin Urine: NEGATIVE
Glucose, UA: NEGATIVE mg/dL
Hgb urine dipstick: NEGATIVE
Ketones, ur: NEGATIVE mg/dL
Nitrite: NEGATIVE
Protein, ur: NEGATIVE mg/dL
Specific Gravity, Urine: 1.025 (ref 1.005–1.030)
pH: 6 (ref 5.0–8.0)

## 2024-01-09 LAB — HCG, SERUM, QUALITATIVE: Preg, Serum: NEGATIVE

## 2024-01-09 MED ORDER — SODIUM CHLORIDE 0.9 % IV BOLUS
1000.0000 mL | Freq: Once | INTRAVENOUS | Status: AC
  Administered 2024-01-09: 1000 mL via INTRAVENOUS

## 2024-01-09 MED ORDER — IBUPROFEN 400 MG PO TABS
600.0000 mg | ORAL_TABLET | Freq: Once | ORAL | Status: AC
  Administered 2024-01-09: 600 mg via ORAL
  Filled 2024-01-09: qty 1

## 2024-01-09 MED ORDER — ACETAMINOPHEN 325 MG PO TABS
650.0000 mg | ORAL_TABLET | Freq: Once | ORAL | Status: AC
  Administered 2024-01-09: 650 mg via ORAL
  Filled 2024-01-09: qty 2

## 2024-01-09 NOTE — Discharge Instructions (Signed)
 Your workup this morning was reassuring. I recommend alternating acetaminophen  and ibuprofen . You may take 650mg  acetaminophen  every 6 hours and may also take 600mg  ibuprofen  every six hours. You may take one of the medications followed by the other medication 6 hours later, and continue to alternate the medications for symptom control. If you develop shortness of breath, severe neck stiffness, chest pain, or other life threatening symptoms please return immediately to the emergency department.

## 2024-01-09 NOTE — ED Provider Notes (Signed)
 [facility] AT [facility] Provider Note   CSN: [id number] Arrival date & time: 01/09/24  0344     Patient presents with: Fever   Casey Hall is a 23 y.o. female.  Patient presents to the emergency department complaining of ongoing fever and chills.  She states that symptoms began this morning at 11 AM.  She was seen in urgent care with relief of symptoms after fluids and Toradol .  She states that she went home and felt better for approximately 4 hours but then began to have more chills and felt febrile.  She has taken no medications since being discharged from the urgent care this evening.  COVID and flu testing at the urgent care were negative.  Patient denies shortness of breath, neck stiffness, abdominal pain, headache, nausea, vomiting, chest pain, urinary symptoms, vaginal discharge.  Patient does state that at the earlier visit she did have a headache but it was relieved with the Toradol  and has not returned.   HPI     Prior to Admission medications   Medication Sig Start Date End Date Taking? Authorizing Provider  acetaminophen  (TYLENOL ) 500 MG tablet Take 2 tablets (1,000 mg total) by mouth every 6 (six) hours as needed for mild pain (pain score 1-3) or headache (for pain scale < 4). 11/19/23   Anyanwu, Ugonna A, MD  albuterol  (VENTOLIN  HFA) 108 (90 Base) MCG/ACT inhaler Inhale 1-2 puffs into the lungs every 6 (six) hours as needed for wheezing or shortness of breath. 07/03/23   Allwardt, Alyssa M, PA-C  ferrous sulfate  325 (65 FE) MG tablet Take 1 tablet (325 mg total) by mouth every other day. 11/20/23   Anyanwu, Ugonna A, MD  ibuprofen  (ADVIL ) 600 MG tablet Take 1 tablet (600 mg total) by mouth every 6 (six) hours as needed for mild pain (pain score 1-3), moderate pain (pain score 4-6) or cramping. 11/19/23   Anyanwu, Ugonna A, MD  Prenatal Vit-Fe Fumarate-FA (MULTIVITAMIN-PRENATAL) 27-0.8 MG TABS tablet Take 1 tablet by mouth daily at 12  noon.    [provider]  senna-docusate (SENOKOT-S) 8.6-50 MG tablet Take 2 tablets by mouth at bedtime as needed for mild constipation or moderate constipation. 11/19/23   Anyanwu, Ugonna A, MD    Allergies: Patient has no known allergies.    Review of Systems  Updated Vital Signs BP 120/74 (BP Location: Left Arm)   Pulse (!) 107   Temp 98.5 F (36.9 C) (Oral)   Resp 20   Ht 5' 2 (1.575 m)   Wt 65.8 kg   LMP 02/05/2023   SpO2 100%   BMI 26.52 kg/m   Physical Exam Vitals and nursing note reviewed.  Constitutional:      General: She is not in acute distress. HENT:     Head: Normocephalic and atraumatic.     Nose: Nose normal.     Mouth/Throat:     Lips: Pink.     Mouth: Mucous membranes are moist.     Pharynx: Oropharynx is clear. Uvula midline. No oropharyngeal exudate, posterior oropharyngeal erythema or uvula swelling.     Tonsils: No tonsillar exudate.  Eyes:     Extraocular Movements: Extraocular movements intact.     Conjunctiva/sclera: Conjunctivae normal.  Neck:     Trachea: Trachea and phonation normal.     Meningeal: Brudzinski's sign and Kernig's sign absent.  Cardiovascular:     Rate and Rhythm: Regular rhythm. Tachycardia present.     Heart sounds: Normal  heart sounds.  Pulmonary:     Effort: Pulmonary effort is normal. No respiratory distress.     Breath sounds: Normal breath sounds.  Abdominal:     General: Bowel sounds are normal.     Palpations: Abdomen is soft.     Tenderness: There is no abdominal tenderness.  Musculoskeletal:     Cervical back: Neck supple. No rigidity.     Right lower leg: No edema.     Left lower leg: No edema.  Lymphadenopathy:     Cervical: No cervical adenopathy.  Skin:    General: Skin is warm and dry.     Capillary Refill: Capillary refill takes less than 2 seconds.     Findings: No rash.  Neurological:     General: No focal deficit present.     Mental Status: She is alert and oriented to person, place,  and time. Mental status is at baseline.     GCS: GCS eye subscore is 4. GCS verbal subscore is 5. GCS motor subscore is 6.     Cranial Nerves: No dysarthria or facial asymmetry.     Sensory: Sensation is intact.     Motor: Motor function is intact. No weakness, tremor, abnormal muscle tone or pronator drift.     Coordination: Coordination is intact. Romberg sign negative. Coordination normal. Finger-Nose-Finger Test normal.     Gait: Gait is intact.  Psychiatric:        Mood and Affect: Mood normal.        Speech: Speech normal.        Behavior: Behavior normal.        Thought Content: Thought content normal.        Judgment: Judgment normal.     (all labs ordered are listed, but only abnormal results are displayed) Labs Reviewed  COMPREHENSIVE METABOLIC PANEL WITH GFR - Abnormal; Notable for the following components:      Result Value   Potassium 3.2 (*)    CO2 19 (*)    Glucose, Bld 104 (*)    Calcium 8.5 (*)    Albumin 3.3 (*)    All other components within normal limits  CBC WITH DIFFERENTIAL/PLATELET - Abnormal; Notable for the following components:   Hemoglobin 10.9 (*)    HCT 34.9 (*)    MCV 76.4 (*)    MCH 23.9 (*)    RDW 20.1 (*)    All other components within normal limits  URINALYSIS, W/ REFLEX TO CULTURE (INFECTION SUSPECTED) - Abnormal; Notable for the following components:   Leukocytes,Ua TRACE (*)    All other components within normal limits  HCG, SERUM, QUALITATIVE  I-STAT CG4 LACTIC ACID, ED    EKG: None  Radiology: DG Chest 2 View Result Date: 01/09/2024 EXAM: 2 VIEW(S) XRAY OF THE CHEST 01/09/2024 04:20:00 AM COMPARISON: PA and lateral radiographs of the chest dated 11/11/2006. CLINICAL HISTORY: Fever and chills. FINDINGS: LUNGS AND PLEURA: No focal pulmonary opacity. No pleural effusion. No pneumothorax. HEART AND MEDIASTINUM: No acute abnormality of the cardiac and mediastinal silhouettes. BONES AND SOFT TISSUES: No acute osseous abnormality.  IMPRESSION: 1. No acute process. Electronically signed by: Evalene Coho MD 01/09/2024 04:24 AM EST RP Workstation: [id number]     Procedures   Medications Ordered in the ED  acetaminophen  (TYLENOL ) tablet 650 mg (650 mg Oral Given 01/09/24 0421)  sodium chloride  0.9 % bolus 1,000 mL (0 mLs Intravenous Stopped 01/09/24 0532)  ibuprofen  (ADVIL ) tablet 600 mg (600 mg Oral Given 01/09/24 0605)  Medical Decision Making Amount and/or Complexity of Data Reviewed Labs: ordered. Radiology: ordered.   This patient presents to the ED for concern of fever and chills, this involves an extensive number of treatment options, and is a complaint that carries with it a high risk of complications and morbidity.  The differential diagnosis includes viral illness, sepsis, urinary tract infection, pneumonia, meningitis, others   Co morbidities / Chronic conditions that complicate the patient evaluation  Asthma   Additional history obtained:  Additional history obtained from EMR External records from outside source obtained and reviewed including urgent care note including results showing negative flu/COVID testing earlier in the day   Lab Tests:  I Ordered, and personally interpreted labs.  The pertinent results include: UA with trace leukocytes, no leukocytosis   Imaging Studies ordered:  No abdominal pain on exam.  No indication for abdominal imaging, no signs of surgical/acute abdomen.  Lungs are clear to auscultation with no reported shortness of breath.  I see no indication for emergent lung imaging at this time.    Problem List / ED Course / Critical interventions / Medication management   I ordered medication including saline bolus, Tylenol , ibuprofen  Reevaluation of the patient after these medicines showed that the patient improved I have reviewed the patients home medicines and have made adjustments as needed   Test / Admission -  Considered:  Patient with symptoms consistent with a viral illness.  COVID and flu testing were negative.  Labs have been grossly unremarkable.  Lactic acid was 0.6.  No sign at this time of sepsis.  Negative pregnancy test.  Lungs are clear to auscultation with no shortness of breath.  No abdominal tenderness, nausea, vomiting.  No neck stiffness. Not consistent with meningitis. Plan to recommend ibuprofen  and Tylenol  for symptom control at home. Return precautions provided.       Final diagnoses:  Influenza-like illness    ED Discharge Orders     None          Logan Ubaldo KATHEE DEVONNA 01/09/24 0617    Griselda Norris, MD 01/10/24 1719

## 2024-01-09 NOTE — ED Notes (Signed)
 Pt ambulated to bathroom. Tolerated well.

## 2024-01-09 NOTE — ED Triage Notes (Signed)
 Went to [facility] last pm with headache fever and chills was tested for coivd and flu was neg, states she had tylenol  and IV fluids not feeling any better.
